# Patient Record
Sex: Male | Born: 1960 | Race: White | Hispanic: No | Marital: Married | Smoking: Former smoker
Health system: Southern US, Community
[De-identification: ages and names within clinical notes are randomized; demographics above are authoritative.]

## PROBLEM LIST (undated history)

## (undated) DIAGNOSIS — I4891 Unspecified atrial fibrillation: Secondary | ICD-10-CM

## (undated) DIAGNOSIS — M51369 Other intervertebral disc degeneration, lumbar region without mention of lumbar back pain or lower extremity pain: Secondary | ICD-10-CM

## (undated) DIAGNOSIS — G459 Transient cerebral ischemic attack, unspecified: Secondary | ICD-10-CM

## (undated) DIAGNOSIS — I10 Essential (primary) hypertension: Secondary | ICD-10-CM

## (undated) DIAGNOSIS — G8929 Other chronic pain: Secondary | ICD-10-CM

## (undated) DIAGNOSIS — R972 Elevated prostate specific antigen [PSA]: Secondary | ICD-10-CM

## (undated) DIAGNOSIS — M5136 Other intervertebral disc degeneration, lumbar region: Secondary | ICD-10-CM

## (undated) HISTORY — DX: Unspecified atrial fibrillation: I48.91

## (undated) HISTORY — DX: Other chronic pain: G89.29

## (undated) HISTORY — DX: Other intervertebral disc degeneration, lumbar region: M51.36

## (undated) HISTORY — DX: Essential (primary) hypertension: I10

## (undated) HISTORY — DX: Transient cerebral ischemic attack, unspecified: G45.9

## (undated) HISTORY — PX: OPEN ANTERIOR SHOULDER RECONSTRUCTION: SHX2100

## (undated) HISTORY — DX: Other intervertebral disc degeneration, lumbar region without mention of lumbar back pain or lower extremity pain: M51.369

## (undated) HISTORY — DX: Elevated prostate specific antigen (PSA): R97.20

## (undated) HISTORY — PX: BACK SURGERY: SHX140

## (undated) HISTORY — PX: OTHER SURGICAL HISTORY: SHX169

## (undated) HISTORY — PX: ATRIAL FIBRILLATION ABLATION: SHX5732

---

## 2021-10-12 ENCOUNTER — Other Ambulatory Visit: Payer: Self-pay | Admitting: Internal Medicine

## 2021-10-12 ENCOUNTER — Other Ambulatory Visit: Payer: Self-pay

## 2021-10-12 ENCOUNTER — Ambulatory Visit
Admission: RE | Admit: 2021-10-12 | Discharge: 2021-10-12 | Disposition: A | Discharge status: Home / Self Care | Payer: Self-pay | Source: Ambulatory Visit | Attending: Internal Medicine | Admitting: Internal Medicine

## 2021-10-12 DIAGNOSIS — R0781 Pleurodynia: Secondary | ICD-10-CM

## 2021-10-12 MED ORDER — KAPSPARGO SPRINKLE 50 MG PO CS24
EXTENDED_RELEASE_CAPSULE | ORAL | 3 refills | Status: DC
  Filled 2021-10-12: qty 30, 30d supply, fill #0
  Filled 2021-10-30: qty 30, 30d supply, fill #1

## 2021-10-12 MED ORDER — HYDROCODONE-ACETAMINOPHEN 10-325 MG PO TABS
ORAL_TABLET | ORAL | 0 refills | Status: DC
  Filled 2021-10-12: qty 60, 30d supply, fill #0

## 2021-10-13 ENCOUNTER — Other Ambulatory Visit: Payer: Self-pay

## 2021-10-16 ENCOUNTER — Other Ambulatory Visit: Payer: Self-pay

## 2021-10-27 ENCOUNTER — Other Ambulatory Visit: Payer: Self-pay

## 2021-10-27 MED ORDER — LOSARTAN POTASSIUM 50 MG PO TABS
ORAL_TABLET | ORAL | 2 refills | Status: DC
  Filled 2021-10-27: qty 30, 30d supply, fill #0
  Filled 2021-12-31: qty 30, 30d supply, fill #1
  Filled 2022-01-24 – 2022-01-31 (×2): qty 30, 30d supply, fill #2
  Filled 2022-04-02: qty 30, 30d supply, fill #3
  Filled 2022-04-25: qty 30, 30d supply, fill #4
  Filled 2022-05-30: qty 30, 30d supply, fill #5
  Filled 2022-06-29 (×2): qty 30, 30d supply, fill #6
  Filled 2022-07-25: qty 30, 30d supply, fill #7
  Filled 2022-08-24 (×2): qty 30, 30d supply, fill #8

## 2021-10-30 ENCOUNTER — Other Ambulatory Visit: Payer: Self-pay

## 2021-10-30 MED ORDER — CEPHALEXIN 500 MG PO CAPS
ORAL_CAPSULE | ORAL | 0 refills | Status: DC
  Filled 2021-10-30: qty 20, 5d supply, fill #0

## 2021-10-31 ENCOUNTER — Other Ambulatory Visit: Payer: Self-pay

## 2021-11-09 ENCOUNTER — Encounter: Payer: Self-pay | Admitting: Cardiology

## 2021-11-09 ENCOUNTER — Other Ambulatory Visit: Payer: Self-pay

## 2021-11-09 ENCOUNTER — Ambulatory Visit: Payer: BLUE CROSS/BLUE SHIELD | Admitting: Cardiology

## 2021-11-09 VITALS — BP 138/98 | HR 86 | Ht 73.0 in | Wt 230.4 lb

## 2021-11-09 DIAGNOSIS — I10 Essential (primary) hypertension: Secondary | ICD-10-CM | POA: Diagnosis not present

## 2021-11-09 DIAGNOSIS — I4819 Other persistent atrial fibrillation: Secondary | ICD-10-CM

## 2021-11-09 NOTE — Patient Instructions (Signed)
Medication Instructions:  Your physician recommends that you continue on your current medications as directed. Please refer to the Current Medication list given to you today.  *If you need a refill on your cardiac medications before your next appointment, please call your pharmacy*  Follow-Up: At [facility], you and your health needs are our priority.  As part of our continuing mission to provide you with exceptional heart care, we have created designated Provider Care Teams.  These Care Teams include your primary Cardiologist (physician) and Advanced Practice Providers (APPs -  Physician Assistants and Nurse Practitioners) who all work together to provide you with the care you need, when you need it.   Your next appointment:   June 1st, 2023 at 3:35pm  The format for your next appointment:   In Person  Provider:   Francis Dowse, PA-C   Important Information About Sugar

## 2021-11-09 NOTE — Progress Notes (Signed)
Cardiology CONSULT Note    Date:  11/09/2021   ID:  Chris Hayes, DOB 12/19/1960, MRN [id number]  PCP:  Emilio Aspen, MD  Cardiologist:  Armanda Magic, MD   Chief Complaint  Patient presents with   Atrial Fibrillation   Hypertension    History of Present Illness:  Chris Hayes is a 61 y.o. male who is being seen today for the evaluation of new onset atrial fibrillation at the request of Emilio Aspen, *.  This is a 62 year old male with a history of hypertension, remote atrial fibrillation status post ablation that was unsuccessful in the past and prior TIA in 1998.  He tells me that he had 2 different arrhythmias and only 1 of them they got and and unfortunately his A-fib ablation was unsuccessful and he has had issues with paroxysmal atrial fibrillation ever since.  He can tell when he is in atrial fibrillation as he feels bad and has shortness of breath with fatigue.  He does not think has been on it recently.Marland Kitchen  He says that he has another arrhythmia that goes 220bpm and that was successfully ablated.  He recently moved here from [place].  He has not been on anticoagulation.  He tells me he was tried on Coumadin but can never get his levels right and he has not ever heard of Xarelto or apixaban in the past.  He is now referred for further evaluation.  He denies any chest pain, chest pressure, shortness of breath, dyspnea on exertion, PND, orthopnea, dizziness, syncope or lower extremity edema.  Occasionally he will notice his heart racing up into the 120's at rest and then resolves.   Past Medical History:  Diagnosis Date   Atrial fibrillation (HCC)    DDD (degenerative disc disease), lumbar    Elevated PSA    HTN (hypertension)    Other chronic pain    TIA (transient ischemic attack)       Current Medications: Current Meds  Medication Sig   cephALEXin (KEFLEX) 500 MG capsule Take 1 capsule by mouth every 6 hrs for 5 days    HYDROcodone-acetaminophen (NORCO) 10-325 MG tablet take 1 tablet as needed by mouth twice a day 30 days   losartan (COZAAR) 50 MG tablet Take 1 tablet by mouth daily.   Metoprolol Succinate (KAPSPARGO SPRINKLE) 50 MG CS24 Take 1 capsule by mouth once a day 90 days    Allergies:   Patient has no allergy information on record.   Social History   Socioeconomic History   Marital status: Married    Spouse name: Not on file   Number of children: Not on file   Years of education: Not on file   Highest education level: Not on file  Occupational History   Not on file  Tobacco Use   Smoking status: Former    Packs/day: 2.00    Types: Cigarettes    Quit date: 11/03/1992    Years since quitting: 29.0   Smokeless tobacco: Never  Substance and Sexual Activity   Alcohol use: Not on file   Drug use: Not on file   Sexual activity: Not on file  Other Topics Concern   Not on file  Social History Narrative   Not on file   Social Determinants of Health   Financial Resource Strain: Not on file  Food Insecurity: Not on file  Transportation Needs: Not on file  Physical Activity: Not on file  Stress: Not on file  Social Connections:  Not on file     Family History:  The patient's family history includes COPD in his father; Coronary artery disease in his father; Parkinson's disease in his mother.   ROS:   Please see the history of present illness.    ROS All other systems reviewed and are negative.      View : No data to display.             PHYSICAL EXAM:   VS:  BP (!) 138/98   Pulse 86   Ht 6\' 1"  (1.854 m)   Wt 230 lb 6.4 oz (104.5 kg)   SpO2 97%   BMI 30.40 kg/m    GEN: Well nourished, well developed, in no acute distress  HEENT: normal  Neck: no JVD, carotid bruits, or masses Cardiac: Regular rate and rhythm; no murmurs, rubs, or gallops,no edema.  Intact distal pulses bilaterally.  Respiratory:  clear to auscultation bilaterally, normal work of breathing GI: soft,  nontender, nondistended, + BS MS: no deformity or atrophy  Skin: warm and dry, no rash Neuro:  Alert and Oriented x 3, Strength and sensation are intact Psych: euthymic mood, full affect  Wt Readings from Last 3 Encounters:  11/09/21 230 lb 6.4 oz (104.5 kg)      Studies/Labs Reviewed:   EKG:  EKG is not ordered today.  Patient refused EKG  Recent Labs: No results found for requested labs within last 8760 hours.   Lipid Panel No results found for: CHOL, TRIG, HDL, CHOLHDL, VLDL, LDLCALC, LDLDIRECT   [id number]-VASc Score = 3   This indicates a 3.2% annual risk of stroke. The patient's score is based upon: CHF History: 0 HTN History: 1 Diabetes History: 0 Stroke History: 2 Vascular Disease History: 0 Age Score: 0 Gender Score: 0   Additional studies/ records that were reviewed today include:  Office visit notes from PCP    ASSESSMENT:    1. Persistent atrial fibrillation (HCC)   2. Benign essential HTN      PLAN:  In order of problems listed above:  Atrial fibrillation -He apparently had a remote A-fib ablation in [place] in the past that was apparently unsuccessful -He has been on Toprol-XL 50 mg daily for suppression of PAF which he intermittently will have -His CHA2DS2-VASc score is 3 and should be on lifetime anticoagulation.  He tells me that he had been on Coumadin in [place] but could never get his levels right and Eliquis or Xarelto were never mentioned -I think we need to get his records from [place] to decide on whether he truly has had intermittent PAF since his ablation and if so then likely needs to be anticoagulated given his remote history of TIA and his history of hypertension which increases his CHA2DS2-VASc score 2 of 3 -He also had a successful ablation of a "very fast HR up to 220bpm" from the bottom of the heart  (?accessory bypass tract) -I think he would benefit from being followed by EP for his cardiac care since he does not have any  other general Cards issues>>refer to Dr. Ladona Ridgelaylor -He is going to see Clementeen Hoofenee Ursay NP tomorrow in EP clinic so he can she can get him established ASAP with an EP physician -Check 2D echo to assess LV function and atrial size  2.  Hypertension -BP is controlled on exam today -Continue prescription drug management with losartan 50 mg daily Toprol-XL 50 mg daily with as needed refills  Time Spent: 25 minutes total time of  encounter, including 15 minutes spent in face-to-face patient care on the date of this encounter. This time includes coordination of care and counseling regarding above mentioned problem list. Remainder of non-face-to-face time involved reviewing chart documents/testing relevant to the patient encounter and documentation in the medical record. I have independently reviewed documentation from referring provider  Medication Adjustments/Labs and Tests Ordered: Current medicines are reviewed at length with the patient today.  Concerns regarding medicines are outlined above.  Medication changes, Labs and Tests ordered today are listed in the Patient Instructions below.  There are no Patient Instructions on file for this visit.   Signed, Armanda Magic, MD  11/09/2021 10:49 AM    [facility] [address] [place], [place], [place]  [address] Phone: [phone]; Fax: [phone]

## 2021-11-10 ENCOUNTER — Other Ambulatory Visit: Payer: Self-pay

## 2021-11-10 ENCOUNTER — Ambulatory Visit: Payer: BLUE CROSS/BLUE SHIELD | Admitting: Physician Assistant

## 2021-11-10 MED ORDER — HYDROCODONE-ACETAMINOPHEN 10-325 MG PO TABS
ORAL_TABLET | ORAL | 0 refills | Status: DC
  Filled 2021-11-10: qty 60, 30d supply, fill #0

## 2021-11-10 NOTE — Progress Notes (Deleted)
Cardiology Office Note Date:  11/10/2021  Patient ID:  Chris Hayes, DOB 1960-09-27, MRN [id number] PCP:  Emilio Aspen, MD  Cardiologist:  Dr. Mayford Knife Electrophysiologist: ***  ***refresh   Chief Complaint: *** AFib management  History of Present Illness: Chris Hayes is a 61 y.o. male with history of HTN, TIA, AFib.  He was referred to Dr. Mayford Knife via his PMD for his hx of AFib hx. She elicited a hx of: "remote atrial fibrillation status post ablation that was unsuccessful in the past and prior TIA in 1998.  He tells me that he had 2 different arrhythmias and only 1 of them they got and and unfortunately his A-fib ablation was unsuccessful" At some point in the past was on warfarin, never an OAC and not currently anticoagulated with reports of labile INRs apparently He apparently reported one of the tachycardias ablated was w/rates 200's and from the bottom of his heart (?) Given his cardiac hx seemed purely arrhythmic referred to EP for ongoing management and decisions on a/c, though felt reasonable to await records from [place] to confirm his rhythm diagnosis'  Pt REFUSED an EKG yesterday  *** risk score is 3 w/hx of TIA , should be on a/c *** burden, ? 30 day? 2 week monitor *** update stuff, echo... *** when was he last seen in [place]?  Do we start over?? *** need an EKG, why did her refuse?? *** does he have any records?  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 Please review them in the "History" navigator section and refresh this SmartLink.  Current Outpatient  Medications  Medication Sig Dispense Refill   cephALEXin (KEFLEX) 500 MG capsule Take 1 capsule by mouth every 6 hrs for 5 days 20 capsule 0   HYDROcodone-acetaminophen (NORCO) 10-325 MG tablet take 1 tablet as needed by mouth twice a day 30 days 60 tablet 0   losartan (COZAAR) 50 MG tablet Take 1 tablet by mouth daily. 90 tablet 2   Metoprolol Succinate (KAPSPARGO SPRINKLE) 50 MG CS24 Take 1 capsule by mouth once a day 90 days 90 capsule 3   No current facility-administered medications for this visit.    Allergies:   Patient has no allergy information on record.   Social History:  The patient  reports that he quit smoking about 29 years ago. His smoking use included cigarettes. He smoked an average of 2 packs per day. He has never used smokeless tobacco.   Family History:  The patient's family history includes COPD in his father; Coronary artery disease in his father; Parkinson's disease in his mother.  ROS:  Please see the history of present illness.    All other systems are reviewed and otherwise negative.   PHYSICAL EXAM:  VS:  There were no vitals taken for this visit. BMI: There is no height or weight on file to calculate BMI. Well nourished, well developed, in no acute distress HEENT: normocephalic, atraumatic Neck: no JVD, carotid bruits or masses Cardiac:  *** RRR; no significant murmurs, no rubs, or gallops  Lungs:  *** CTA b/l, no wheezing, rhonchi or rales Abd: soft, nontender MS: no deformity or *** atrophy Ext: *** no edema Skin: warm and dry, no rash Neuro:  No gross deficits appreciated Psych: euthymic mood, full affect   EKG:  Done today and reviewed by myself shows  ***   Recent Labs: No results found for requested labs within last 8760 hours.  No results found for requested labs within last 8760 hours.   CrCl cannot be calculated (No successful lab value found.).   Wt Readings from Last 3 Encounters:  11/09/21 230 lb 6.4 oz (104.5 kg)     Other  studies reviewed: Additional studies/records reviewed today include: summarized above  ASSESSMENT AND PLAN:  ***  Disposition: F/u with ***  Current medicines are reviewed at length with the patient today.  The patient did not have any concerns regarding medicines.  Norma Fredrickson, PA-C 11/10/2021 6:00 AM     [facility] [address] [place] [address] [phone] (office)  [phone] (fax)

## 2021-11-20 ENCOUNTER — Other Ambulatory Visit: Payer: Self-pay

## 2021-11-20 MED ORDER — METOPROLOL SUCCINATE ER 50 MG PO TB24
ORAL_TABLET | Freq: Every day | ORAL | 5 refills | Status: DC
  Filled 2021-11-20 – 2021-12-31 (×2): qty 30, 30d supply, fill #0
  Filled 2022-01-24 – 2022-01-31 (×2): qty 30, 30d supply, fill #1
  Filled 2022-04-02: qty 30, 30d supply, fill #2
  Filled 2022-04-25: qty 30, 30d supply, fill #3
  Filled 2022-05-30: qty 30, 30d supply, fill #4
  Filled 2022-06-29 (×2): qty 30, 30d supply, fill #5

## 2021-11-27 ENCOUNTER — Inpatient Hospital Stay (HOSPITAL_COMMUNITY): Payer: BLUE CROSS/BLUE SHIELD | Admitting: Anesthesiology

## 2021-11-27 ENCOUNTER — Other Ambulatory Visit: Payer: Self-pay

## 2021-11-27 ENCOUNTER — Inpatient Hospital Stay (HOSPITAL_COMMUNITY)
Admission: EM | Admit: 2021-11-27 | Discharge: 2021-12-01 | DRG: 482 | Disposition: A | Discharge status: Home Health | Payer: BLUE CROSS/BLUE SHIELD | Attending: Family Medicine | Admitting: Family Medicine

## 2021-11-27 ENCOUNTER — Encounter (HOSPITAL_COMMUNITY): Payer: Self-pay | Admitting: Internal Medicine

## 2021-11-27 ENCOUNTER — Inpatient Hospital Stay (HOSPITAL_COMMUNITY): Payer: BLUE CROSS/BLUE SHIELD

## 2021-11-27 ENCOUNTER — Encounter (HOSPITAL_COMMUNITY)
Admission: EM | Disposition: A | Discharge status: Home Health | Payer: Self-pay | Source: Home / Self Care | Attending: Family Medicine

## 2021-11-27 ENCOUNTER — Emergency Department (HOSPITAL_COMMUNITY): Payer: BLUE CROSS/BLUE SHIELD

## 2021-11-27 DIAGNOSIS — I10 Essential (primary) hypertension: Secondary | ICD-10-CM | POA: Diagnosis present

## 2021-11-27 DIAGNOSIS — I480 Paroxysmal atrial fibrillation: Secondary | ICD-10-CM | POA: Diagnosis present

## 2021-11-27 DIAGNOSIS — Z8249 Family history of ischemic heart disease and other diseases of the circulatory system: Secondary | ICD-10-CM | POA: Diagnosis not present

## 2021-11-27 DIAGNOSIS — Z825 Family history of asthma and other chronic lower respiratory diseases: Secondary | ICD-10-CM

## 2021-11-27 DIAGNOSIS — Z87891 Personal history of nicotine dependence: Secondary | ICD-10-CM | POA: Diagnosis not present

## 2021-11-27 DIAGNOSIS — L8990 Pressure ulcer of unspecified site, unspecified stage: Secondary | ICD-10-CM | POA: Insufficient documentation

## 2021-11-27 DIAGNOSIS — S72009A Fracture of unspecified part of neck of unspecified femur, initial encounter for closed fracture: Secondary | ICD-10-CM | POA: Diagnosis present

## 2021-11-27 DIAGNOSIS — Z79899 Other long term (current) drug therapy: Secondary | ICD-10-CM

## 2021-11-27 DIAGNOSIS — Z820 Family history of epilepsy and other diseases of the nervous system: Secondary | ICD-10-CM

## 2021-11-27 DIAGNOSIS — S72002A Fracture of unspecified part of neck of left femur, initial encounter for closed fracture: Secondary | ICD-10-CM

## 2021-11-27 DIAGNOSIS — S72142A Displaced intertrochanteric fracture of left femur, initial encounter for closed fracture: Principal | ICD-10-CM | POA: Diagnosis present

## 2021-11-27 DIAGNOSIS — Z8673 Personal history of transient ischemic attack (TIA), and cerebral infarction without residual deficits: Secondary | ICD-10-CM

## 2021-11-27 DIAGNOSIS — Y929 Unspecified place or not applicable: Secondary | ICD-10-CM | POA: Diagnosis not present

## 2021-11-27 DIAGNOSIS — W19XXXA Unspecified fall, initial encounter: Principal | ICD-10-CM

## 2021-11-27 DIAGNOSIS — W08XXXA Fall from other furniture, initial encounter: Secondary | ICD-10-CM | POA: Diagnosis present

## 2021-11-27 HISTORY — PX: INTRAMEDULLARY (IM) NAIL INTERTROCHANTERIC: SHX5875

## 2021-11-27 LAB — CBC WITH DIFFERENTIAL/PLATELET
Abs Immature Granulocytes: 0.03 10*3/uL (ref 0.00–0.07)
Basophils Absolute: 0.1 10*3/uL (ref 0.0–0.1)
Basophils Relative: 1 %
Eosinophils Absolute: 0.3 10*3/uL (ref 0.0–0.5)
Eosinophils Relative: 3 %
HCT: 44.5 % (ref 39.0–52.0)
Hemoglobin: 14.9 g/dL (ref 13.0–17.0)
Immature Granulocytes: 0 %
Lymphocytes Relative: 10 %
Lymphs Abs: 0.9 10*3/uL (ref 0.7–4.0)
MCH: 30.3 pg (ref 26.0–34.0)
MCHC: 33.5 g/dL (ref 30.0–36.0)
MCV: 90.6 fL (ref 80.0–100.0)
Monocytes Absolute: 0.7 10*3/uL (ref 0.1–1.0)
Monocytes Relative: 7 %
Neutro Abs: 7.6 10*3/uL (ref 1.7–7.7)
Neutrophils Relative %: 79 %
Platelets: 189 10*3/uL (ref 150–400)
RBC: 4.91 MIL/uL (ref 4.22–5.81)
RDW: 12.8 % (ref 11.5–15.5)
WBC: 9.6 10*3/uL (ref 4.0–10.5)
nRBC: 0 % (ref 0.0–0.2)

## 2021-11-27 LAB — PROTIME-INR
INR: 1 (ref 0.8–1.2)
Prothrombin Time: 13.3 seconds (ref 11.4–15.2)

## 2021-11-27 LAB — TYPE AND SCREEN
ABO/RH(D): A POS
Antibody Screen: NEGATIVE

## 2021-11-27 LAB — SURGICAL PCR SCREEN
MRSA, PCR: NEGATIVE
Staphylococcus aureus: NEGATIVE

## 2021-11-27 LAB — BASIC METABOLIC PANEL
Anion gap: 7 (ref 5–15)
BUN: 11 mg/dL (ref 6–20)
CO2: 25 mmol/L (ref 22–32)
Calcium: 9.1 mg/dL (ref 8.9–10.3)
Chloride: 112 mmol/L — ABNORMAL HIGH (ref 98–111)
Creatinine, Ser: 0.84 mg/dL (ref 0.61–1.24)
GFR, Estimated: >60 mL/min (ref >60)
Glucose, Bld: 99 mg/dL (ref 70–99)
Potassium: 3.6 mmol/L (ref 3.5–5.1)
Sodium: 144 mmol/L (ref 135–145)

## 2021-11-27 LAB — ABO/RH: ABO/RH(D): A POS

## 2021-11-27 SURGERY — FIXATION, FRACTURE, INTERTROCHANTERIC, WITH INTRAMEDULLARY ROD
Anesthesia: General | Site: Hip | Laterality: Left

## 2021-11-27 MED ORDER — LACTATED RINGERS IV SOLN: INTRAVENOUS | Status: DC | PRN

## 2021-11-27 MED ORDER — AMISULPRIDE (ANTIEMETIC) 5 MG/2ML IV SOLN: 10.0000 mg | Freq: Once | INTRAVENOUS | Status: DC | PRN

## 2021-11-27 MED ORDER — DEXAMETHASONE SODIUM PHOSPHATE 10 MG/ML IJ SOLN
INTRAMUSCULAR | Status: AC
  Filled 2021-11-27: qty 1

## 2021-11-27 MED ORDER — ACETAMINOPHEN 325 MG PO TABS
325.0000 mg | ORAL_TABLET | Freq: Four times a day (QID) | ORAL | Status: DC | PRN
  Administered 2021-11-29 – 2021-12-01 (×2): 650 mg via ORAL
  Filled 2021-11-27 (×2): qty 2

## 2021-11-27 MED ORDER — FENTANYL CITRATE (PF) 250 MCG/5ML IJ SOLN
INTRAMUSCULAR | Status: DC | PRN
  Administered 2021-11-27: 25 ug via INTRAVENOUS
  Administered 2021-11-27: 50 ug via INTRAVENOUS
  Administered 2021-11-27: 25 ug via INTRAVENOUS

## 2021-11-27 MED ORDER — LOSARTAN POTASSIUM 50 MG PO TABS
50.0000 mg | ORAL_TABLET | Freq: Every day | ORAL | Status: DC
  Filled 2021-11-27 (×4): qty 1

## 2021-11-27 MED ORDER — OXYCODONE HCL 5 MG PO TABS: 5.0000 mg | ORAL_TABLET | Freq: Once | ORAL | Status: DC | PRN

## 2021-11-27 MED ORDER — LACTATED RINGERS IV SOLN: INTRAVENOUS | Status: DC

## 2021-11-27 MED ORDER — PHENYLEPHRINE 80 MCG/ML (10ML) SYRINGE FOR IV PUSH (FOR BLOOD PRESSURE SUPPORT)
PREFILLED_SYRINGE | INTRAVENOUS | Status: DC | PRN
  Administered 2021-11-27 (×2): 160 ug via INTRAVENOUS

## 2021-11-27 MED ORDER — HYDROMORPHONE HCL 2 MG/ML IJ SOLN
2.0000 mg | Freq: Once | INTRAMUSCULAR | Status: AC
  Administered 2021-11-27: 2 mg via INTRAVENOUS
  Filled 2021-11-27: qty 1

## 2021-11-27 MED ORDER — TRANEXAMIC ACID-NACL 1000-0.7 MG/100ML-% IV SOLN
1000.0000 mg | INTRAVENOUS | Status: AC
  Administered 2021-11-27: 1000 mg via INTRAVENOUS
  Filled 2021-11-27: qty 100

## 2021-11-27 MED ORDER — SUGAMMADEX SODIUM 500 MG/5ML IV SOLN
INTRAVENOUS | Status: AC
  Filled 2021-11-27: qty 5

## 2021-11-27 MED ORDER — POVIDONE-IODINE 10 % EX SWAB
2.0000 | Freq: Once | CUTANEOUS | Status: AC
  Administered 2021-11-27: 2 via TOPICAL

## 2021-11-27 MED ORDER — MENTHOL 3 MG MT LOZG: 1.0000 | LOZENGE | OROMUCOSAL | Status: DC | PRN

## 2021-11-27 MED ORDER — ACETAMINOPHEN 325 MG PO TABS: 650.0000 mg | ORAL_TABLET | Freq: Four times a day (QID) | ORAL | Status: DC | PRN

## 2021-11-27 MED ORDER — POVIDONE-IODINE 10 % EX SWAB: 2.0000 | Freq: Once | CUTANEOUS | Status: DC

## 2021-11-27 MED ORDER — HYDROMORPHONE HCL 1 MG/ML IJ SOLN: 0.5000 mg | INTRAMUSCULAR | Status: DC | PRN

## 2021-11-27 MED ORDER — METHOCARBAMOL 500 MG PO TABS
500.0000 mg | ORAL_TABLET | Freq: Four times a day (QID) | ORAL | Status: DC | PRN
  Administered 2021-11-28 – 2021-12-01 (×6): 500 mg via ORAL
  Filled 2021-11-27 (×6): qty 1

## 2021-11-27 MED ORDER — MIDAZOLAM HCL 2 MG/2ML IJ SOLN
INTRAMUSCULAR | Status: AC
  Filled 2021-11-27: qty 2

## 2021-11-27 MED ORDER — ACETAMINOPHEN 650 MG RE SUPP: 650.0000 mg | Freq: Four times a day (QID) | RECTAL | Status: DC | PRN

## 2021-11-27 MED ORDER — ACETAMINOPHEN 10 MG/ML IV SOLN
INTRAVENOUS | Status: AC
  Filled 2021-11-27: qty 100

## 2021-11-27 MED ORDER — ASPIRIN 81 MG PO TBEC
81.0000 mg | DELAYED_RELEASE_TABLET | Freq: Two times a day (BID) | ORAL | Status: DC
  Administered 2021-11-28 – 2021-12-01 (×7): 81 mg via ORAL
  Filled 2021-11-27 (×7): qty 1

## 2021-11-27 MED ORDER — POLYETHYLENE GLYCOL 3350 17 G PO PACK
17.0000 g | PACK | Freq: Every day | ORAL | Status: DC | PRN
  Administered 2021-11-30: 17 g via ORAL
  Filled 2021-11-27 (×2): qty 1

## 2021-11-27 MED ORDER — CEFAZOLIN SODIUM-DEXTROSE 2-4 GM/100ML-% IV SOLN
2.0000 g | INTRAVENOUS | Status: AC
  Administered 2021-11-27: 2 g via INTRAVENOUS
  Filled 2021-11-27: qty 100

## 2021-11-27 MED ORDER — METHOCARBAMOL 500 MG IVPB - SIMPLE MED: 500.0000 mg | Freq: Four times a day (QID) | INTRAVENOUS | Status: DC | PRN

## 2021-11-27 MED ORDER — ONDANSETRON HCL 4 MG/2ML IJ SOLN
INTRAMUSCULAR | Status: DC | PRN
  Administered 2021-11-27: 4 mg via INTRAVENOUS

## 2021-11-27 MED ORDER — BISACODYL 10 MG RE SUPP
10.0000 mg | Freq: Every day | RECTAL | Status: DC | PRN
Start: 2021-11-27 — End: 2021-12-01
  Administered 2021-11-30: 10 mg via RECTAL
  Filled 2021-11-27: qty 1

## 2021-11-27 MED ORDER — ACETAMINOPHEN 10 MG/ML IV SOLN
INTRAVENOUS | Status: DC | PRN
  Administered 2021-11-27: 1000 mg via INTRAVENOUS

## 2021-11-27 MED ORDER — PHENOL 1.4 % MT LIQD: 1.0000 | OROMUCOSAL | Status: DC | PRN

## 2021-11-27 MED ORDER — METOPROLOL SUCCINATE ER 50 MG PO TB24
50.0000 mg | ORAL_TABLET | Freq: Every day | ORAL | Status: DC
  Administered 2021-11-28 – 2021-12-01 (×4): 50 mg via ORAL
  Filled 2021-11-27 (×6): qty 1

## 2021-11-27 MED ORDER — MORPHINE SULFATE (PF) 2 MG/ML IV SOLN
0.5000 mg | INTRAVENOUS | Status: DC | PRN
  Administered 2021-11-27 – 2021-11-28 (×2): 1 mg via INTRAVENOUS
  Administered 2021-11-28: 0.5 mg via INTRAVENOUS
  Administered 2021-11-28 – 2021-11-29 (×6): 1 mg via INTRAVENOUS
  Administered 2021-11-29: 0.5 mg via INTRAVENOUS
  Administered 2021-11-30: 1 mg via INTRAVENOUS
  Filled 2021-11-27 (×12): qty 1

## 2021-11-27 MED ORDER — METOCLOPRAMIDE HCL 5 MG/ML IJ SOLN: 5.0000 mg | Freq: Three times a day (TID) | INTRAMUSCULAR | Status: DC | PRN

## 2021-11-27 MED ORDER — SUGAMMADEX SODIUM 200 MG/2ML IV SOLN
INTRAVENOUS | Status: DC | PRN
  Administered 2021-11-27: 217.8 mg via INTRAVENOUS

## 2021-11-27 MED ORDER — PHENYLEPHRINE HCL-NACL 20-0.9 MG/250ML-% IV SOLN
INTRAVENOUS | Status: DC | PRN
  Administered 2021-11-27: 20 ug/min via INTRAVENOUS

## 2021-11-27 MED ORDER — ROCURONIUM BROMIDE 10 MG/ML (PF) SYRINGE
PREFILLED_SYRINGE | INTRAVENOUS | Status: DC | PRN
  Administered 2021-11-27: 40 mg via INTRAVENOUS
  Administered 2021-11-27: 20 mg via INTRAVENOUS

## 2021-11-27 MED ORDER — HYDROCODONE-ACETAMINOPHEN 5-325 MG PO TABS
1.0000 | ORAL_TABLET | ORAL | Status: DC | PRN
  Administered 2021-11-29: 2 via ORAL
  Administered 2021-11-30: 1 via ORAL
  Administered 2021-11-30 – 2021-12-01 (×3): 2 via ORAL
  Filled 2021-11-27 (×5): qty 2
  Filled 2021-11-27: qty 1

## 2021-11-27 MED ORDER — OXYCODONE HCL 5 MG/5ML PO SOLN: 5.0000 mg | Freq: Once | ORAL | Status: DC | PRN

## 2021-11-27 MED ORDER — PROPOFOL 10 MG/ML IV BOLUS
INTRAVENOUS | Status: AC
  Filled 2021-11-27: qty 20

## 2021-11-27 MED ORDER — LIDOCAINE 2% (20 MG/ML) 5 ML SYRINGE
INTRAMUSCULAR | Status: DC | PRN
  Administered 2021-11-27: 60 mg via INTRAVENOUS

## 2021-11-27 MED ORDER — HYDROMORPHONE HCL 1 MG/ML IJ SOLN
0.2500 mg | INTRAMUSCULAR | Status: DC | PRN
  Administered 2021-11-27 (×2): 0.25 mg via INTRAVENOUS

## 2021-11-27 MED ORDER — TRANEXAMIC ACID-NACL 1000-0.7 MG/100ML-% IV SOLN
1000.0000 mg | Freq: Once | INTRAVENOUS | Status: AC
  Administered 2021-11-27: 1000 mg via INTRAVENOUS
  Filled 2021-11-27: qty 100

## 2021-11-27 MED ORDER — CEFAZOLIN SODIUM-DEXTROSE 2-4 GM/100ML-% IV SOLN
2.0000 g | Freq: Four times a day (QID) | INTRAVENOUS | Status: AC
  Administered 2021-11-27 – 2021-11-28 (×2): 2 g via INTRAVENOUS
  Filled 2021-11-27 (×2): qty 100

## 2021-11-27 MED ORDER — ONDANSETRON HCL 4 MG/2ML IJ SOLN: 4.0000 mg | Freq: Four times a day (QID) | INTRAMUSCULAR | Status: DC | PRN

## 2021-11-27 MED ORDER — MIDAZOLAM HCL 2 MG/2ML IJ SOLN
INTRAMUSCULAR | Status: DC | PRN
  Administered 2021-11-27: 2 mg via INTRAVENOUS

## 2021-11-27 MED ORDER — ONDANSETRON HCL 4 MG PO TABS: 4.0000 mg | ORAL_TABLET | Freq: Four times a day (QID) | ORAL | Status: DC | PRN

## 2021-11-27 MED ORDER — HYDROMORPHONE HCL 1 MG/ML IJ SOLN
1.0000 mg | INTRAMUSCULAR | Status: AC | PRN
  Administered 2021-11-27 (×2): 1 mg via INTRAVENOUS
  Filled 2021-11-27 (×2): qty 1

## 2021-11-27 MED ORDER — DOCUSATE SODIUM 100 MG PO CAPS
100.0000 mg | ORAL_CAPSULE | Freq: Two times a day (BID) | ORAL | Status: DC
  Administered 2021-11-27 – 2021-12-01 (×8): 100 mg via ORAL
  Filled 2021-11-27 (×9): qty 1

## 2021-11-27 MED ORDER — HYDROMORPHONE HCL 1 MG/ML IJ SOLN
INTRAMUSCULAR | Status: AC
  Filled 2021-11-27: qty 1

## 2021-11-27 MED ORDER — FENTANYL CITRATE (PF) 100 MCG/2ML IJ SOLN
INTRAMUSCULAR | Status: AC
  Filled 2021-11-27: qty 2

## 2021-11-27 MED ORDER — CHLORHEXIDINE GLUCONATE 4 % EX LIQD: 60.0000 mL | Freq: Once | CUTANEOUS | Status: DC

## 2021-11-27 MED ORDER — METOCLOPRAMIDE HCL 5 MG PO TABS: 5.0000 mg | ORAL_TABLET | Freq: Three times a day (TID) | ORAL | Status: DC | PRN

## 2021-11-27 MED ORDER — ONDANSETRON HCL 4 MG/2ML IJ SOLN: 4.0000 mg | Freq: Once | INTRAMUSCULAR | Status: DC | PRN

## 2021-11-27 MED ORDER — HYDROCODONE-ACETAMINOPHEN 7.5-325 MG PO TABS
1.0000 | ORAL_TABLET | ORAL | Status: DC | PRN
  Administered 2021-11-28 (×3): 1 via ORAL
  Administered 2021-11-29: 2 via ORAL
  Administered 2021-11-29 (×3): 1 via ORAL
  Administered 2021-11-30 (×4): 2 via ORAL
  Filled 2021-11-27: qty 1
  Filled 2021-11-27 (×2): qty 2
  Filled 2021-11-27 (×3): qty 1
  Filled 2021-11-27 (×2): qty 2
  Filled 2021-11-27: qty 1
  Filled 2021-11-27: qty 2
  Filled 2021-11-27: qty 1

## 2021-11-27 MED ORDER — 0.9 % SODIUM CHLORIDE (POUR BTL) OPTIME
TOPICAL | Status: DC | PRN
  Administered 2021-11-27: 1000 mL

## 2021-11-27 MED ORDER — PROPOFOL 10 MG/ML IV BOLUS
INTRAVENOUS | Status: DC | PRN
  Administered 2021-11-27: 100 mg via INTRAVENOUS

## 2021-11-27 MED ORDER — SUCCINYLCHOLINE CHLORIDE 200 MG/10ML IV SOSY
PREFILLED_SYRINGE | INTRAVENOUS | Status: DC | PRN
  Administered 2021-11-27: 200 mg via INTRAVENOUS

## 2021-11-27 MED ORDER — DEXAMETHASONE SODIUM PHOSPHATE 10 MG/ML IJ SOLN
INTRAMUSCULAR | Status: DC | PRN
  Administered 2021-11-27: 8 mg via INTRAVENOUS

## 2021-11-27 MED ORDER — SODIUM CHLORIDE 0.9 % IV SOLN: INTRAVENOUS | Status: DC

## 2021-11-27 MED ORDER — ONDANSETRON HCL 4 MG/2ML IJ SOLN
4.0000 mg | Freq: Four times a day (QID) | INTRAMUSCULAR | Status: DC | PRN
  Administered 2021-11-27 (×2): 4 mg via INTRAVENOUS
  Filled 2021-11-27 (×2): qty 2

## 2021-11-27 MED ORDER — ONDANSETRON HCL 4 MG/2ML IJ SOLN
INTRAMUSCULAR | Status: AC
  Filled 2021-11-27: qty 2

## 2021-11-27 SURGICAL SUPPLY — 38 items
BAG COUNTER SPONGE SURGICOUNT (BAG) IMPLANT
BAG ZIPLOCK 12X15 (MISCELLANEOUS) ×2 IMPLANT
BIT DRILL CANN LG 4.3MM (BIT) IMPLANT
COVER PERINEAL POST (MISCELLANEOUS) ×2 IMPLANT
COVER SURGICAL LIGHT HANDLE (MISCELLANEOUS) ×2 IMPLANT
DERMABOND ADVANCED (GAUZE/BANDAGES/DRESSINGS) ×1
DERMABOND ADVANCED .7 DNX12 (GAUZE/BANDAGES/DRESSINGS) IMPLANT
DRAPE STERI IOBAN 125X83 (DRAPES) ×2 IMPLANT
DRILL BIT CANN LG 4.3MM (BIT) ×2
DRSG AQUACEL AG ADV 3.5X 4 (GAUZE/BANDAGES/DRESSINGS) ×4 IMPLANT
DRSG AQUACEL AG ADV 3.5X 6 (GAUZE/BANDAGES/DRESSINGS) ×3 IMPLANT
DURAPREP 26ML APPLICATOR (WOUND CARE) ×2 IMPLANT
ELECT REM PT RETURN 15FT ADLT (MISCELLANEOUS) ×2 IMPLANT
GLOVE BIOGEL PI IND STRL 7.5 (GLOVE) ×2 IMPLANT
GLOVE BIOGEL PI IND STRL 8.5 (GLOVE) ×1 IMPLANT
GLOVE BIOGEL PI INDICATOR 7.5 (GLOVE) ×2
GLOVE BIOGEL PI INDICATOR 8.5 (GLOVE) ×1
GLOVE ECLIPSE 8.0 STRL XLNG CF (GLOVE) ×2 IMPLANT
GLOVE INDICATOR 6.5 STRL GRN (GLOVE) ×2 IMPLANT
GOWN STRL REUS W/ TWL LRG LVL3 (GOWN DISPOSABLE) ×2 IMPLANT
GOWN STRL REUS W/TWL LRG LVL3 (GOWN DISPOSABLE) ×2
GUIDEPIN VERSANAIL DSP 3.2X444 (ORTHOPEDIC DISPOSABLE SUPPLIES) ×1 IMPLANT
HIP FR NAIL LAG SCREW 10.5X110 (Orthopedic Implant) ×2 IMPLANT
KIT BASIN OR (CUSTOM PROCEDURE TRAY) ×2 IMPLANT
KIT TURNOVER KIT A (KITS) IMPLANT
MANIFOLD NEPTUNE II (INSTRUMENTS) ×2 IMPLANT
NAIL HIP FRACT 130D 11X180 (Screw) ×1 IMPLANT
PACK GENERAL/GYN (CUSTOM PROCEDURE TRAY) ×2 IMPLANT
PADDING CAST COTTON 6X4 STRL (CAST SUPPLIES) ×2 IMPLANT
PROTECTOR NERVE ULNAR (MISCELLANEOUS) ×2 IMPLANT
SCREW BONE CORTICAL 5.0X36 (Screw) ×1 IMPLANT
SCREW LAG HIP FR NAIL 10.5X110 (Orthopedic Implant) IMPLANT
SUT VIC AB 1 CT1 36 (SUTURE) ×2 IMPLANT
SUT VIC AB 2-0 CT1 27 (SUTURE) ×3
SUT VIC AB 2-0 CT1 27XBRD (SUTURE) ×2 IMPLANT
SUT VIC AB 2-0 CT1 TAPERPNT 27 (SUTURE) IMPLANT
TOWEL OR 17X26 10 PK STRL BLUE (TOWEL DISPOSABLE) ×2 IMPLANT
TOWEL OR NON WOVEN STRL DISP B (DISPOSABLE) ×2 IMPLANT

## 2021-11-27 NOTE — Transfer of Care (Addendum)
Immediate Anesthesia Transfer of Care Note  Patient: Chris Hayes  Procedure(s) Performed: INTRAMEDULLARY (IM) NAIL INTERTROCHANTRIC (Left: Hip)  Patient Location: PACU  Anesthesia Type:GA combined with regional for post-op pain  Level of Consciousness: drowsy and patient cooperative  Airway & Oxygen Therapy: Patient Spontanous Breathing and Patient connected to face mask oxygen  Post-op Assessment: Report given to RN and Post -op Vital signs reviewed and stable  Post vital signs: Reviewed and stable  Last Vitals:  Vitals Value Taken Time  BP 118/99 11/27/21 1915  Temp    Pulse 78 11/27/21 1921  Resp 10 11/27/21 1921  SpO2 100 % 11/27/21 1921  Vitals shown include unvalidated device data.  Last Pain:  Vitals:   11/27/21 1744  TempSrc: Oral  PainSc: 10-Worst pain ever         Complications: No notable events documented.

## 2021-11-27 NOTE — Anesthesia Procedure Notes (Signed)
Anesthesia Regional Block: Femoral nerve block   Pre-Anesthetic Checklist: , timeout performed,  Correct Patient, Correct Site, Correct Laterality,  Correct Procedure, Correct Position, site marked,  Risks and benefits discussed,  Surgical consent,  Pre-op evaluation,  At surgeon's request and post-op pain management  Laterality: Left  Prep: Maximum Sterile Barrier Precautions used, chloraprep       Needles:  Injection technique: Single-shot  Needle Type: Echogenic Stimulator Needle     Needle Length: 9cm  Needle Gauge: 22     Additional Needles:   Procedures:,,,, ultrasound used (permanent image in chart),,    Narrative:  Start time: 11/27/2021 5:58 PM End time: 11/27/2021 6:05 PM Injection made incrementally with aspirations every 5 mL.  Performed by: Personally  Anesthesiologist: Lannie Fields, DO  Additional Notes: Monitors applied. No increased pain on injection. No increased resistance to injection. Injection made in 5cc increments. Good needle visualization. Patient tolerated procedure well.

## 2021-11-27 NOTE — Brief Op Note (Signed)
11/27/2021  6:54 PM  PATIENT:  Chris Hayes  61 y.o. male  PRE-OPERATIVE DIAGNOSIS:  left comminuted intertrochanteric femur fracture  POST-OPERATIVE DIAGNOSIS:  left comminuted intertrochanteric femur fracture  PROCEDURE:  Procedure(s): INTRAMEDULLARY (IM) NAIL INTERTROCHANTRIC (Left)  SURGEON:  Surgeon(s) and Role:    Durene Romans, MD - Primary  PHYSICIAN ASSISTANT: Rosalene Billings, PA-C   ANESTHESIA:   regional and general  EBL:  100 mL   BLOOD ADMINISTERED:none  DRAINS: none   LOCAL MEDICATIONS USED:  NONE  SPECIMEN:  No Specimen  DISPOSITION OF SPECIMEN:  N/A  COUNTS:  YES  TOURNIQUET:  * No tourniquets in log *  DICTATION: .Other Dictation: Dictation Number [id number]  PLAN OF CARE: Admit to inpatient   PATIENT DISPOSITION:  PACU - hemodynamically stable.   Delay start of Pharmacological VTE agent (>24hrs) due to surgical blood loss or risk of bleeding: no

## 2021-11-27 NOTE — Interval H&P Note (Signed)
History and Physical Interval Note:  11/27/2021 6:09 PM  Chris Hayes  has presented today for surgery, with the diagnosis of intertrochanteric femur fracture.  The various methods of treatment have been discussed with the patient and family. After consideration of risks, benefits and other options for treatment, the patient has consented to  Procedure(s): INTRAMEDULLARY (IM) NAIL INTERTROCHANTRIC (Left) as a surgical intervention.  The patient's history has been reviewed, patient examined, no change in status, stable for surgery.  I have reviewed the patient's chart and labs.  Questions were answered to the patient's satisfaction.     Shelda Pal

## 2021-11-27 NOTE — H&P (View-Only) (Signed)
Reason for Consult:left hip fracture Referring Physician: Lockwood, MD (ER)  Chris Hayes is an 61 y.o. male.  HPI:  Patient presents via EMS with concern for pain in his left hip.  Just prior to calling EMS he fell from a ladder landing on his left hip.  Since that time has been nonambulatory with severe sustained pain in the left hip.  No head trauma, or other injuries, no other complaints.  EMS reports that in spite of 200 mcg of fentanyl he continues to complain of pain.  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 His smoking use included cigarettes. He smoked an average of 2 packs per day. He has never used smokeless tobacco. No history on file for alcohol use and drug use.  Allergies: No Known Allergies  Medications: I have reviewed the patient's current medications. Scheduled:  Results for orders placed or performed during the hospital encounter of 11/27/21 (from the past 24 hour(s))  Basic metabolic panel     Status: Abnormal   Collection Time: 11/27/21  2:10 PM  Result Value Ref Range   Sodium 144 135 - 145 mmol/L   Potassium 3.6 3.5 - 5.1 mmol/L   Chloride 112 (H) 98 - 111 mmol/L   CO2 25 22 - 32 mmol/L   Glucose, Bld 99 70 - 99 mg/dL   BUN 11 6 - 20 mg/dL   Creatinine, Ser 0.84 0.61 - 1.24 mg/dL   Calcium 9.1 8.9 - 10.3 mg/dL   GFR, Estimated >60 >60 mL/min   Anion gap 7 5 - 15  CBC with Differential     Status: None   Collection Time: 11/27/21   2:10 PM  Result Value Ref Range   WBC 9.6 4.0 - 10.5 K/uL   RBC 4.91 4.22 - 5.81 MIL/uL   Hemoglobin 14.9 13.0 - 17.0 g/dL   HCT 44.5 39.0 - 52.0 %   MCV 90.6 80.0 - 100.0 fL   MCH 30.3 26.0 - 34.0 pg   MCHC 33.5 30.0 - 36.0 g/dL   RDW 12.8 11.5 - 15.5 %   Platelets 189 150 - 400 K/uL   nRBC 0.0 0.0 - 0.2 %   Neutrophils Relative % 79 %   Neutro Abs 7.6 1.7 - 7.7 K/uL   Lymphocytes Relative 10 %   Lymphs Abs 0.9 0.7 - 4.0 K/uL   Monocytes Relative 7 %   Monocytes Absolute 0.7 0.1 - 1.0 K/uL   Eosinophils Relative 3 %   Eosinophils Absolute 0.3 0.0 - 0.5 K/uL   Basophils Relative 1 %   Basophils Absolute 0.1 0.0 - 0.1 K/uL   Immature Granulocytes 0 %   Abs Immature Granulocytes 0.03 0.00 - 0.07 K/uL  Protime-INR     Status: None   Collection Time: 11/27/21  2:10 PM  Result Value Ref Range   Prothrombin Time 13.3 11.4 - 15.2 seconds     INR 1.0 0.8 - 1.2  Type and screen [facility]     Status: None   Collection Time: 11/27/21  2:10 PM  Result Value Ref Range   ABO/RH(D) A POS    Antibody Screen NEG    Sample Expiration      11/30/2021,2359 Performed at [facility], [address], [place], [place] [address]      X-ray: CLINICAL DATA:  Fall from ladder   EXAM: DG HIP (WITH OR WITHOUT PELVIS) 2-3V LEFT   COMPARISON:  None Available.   FINDINGS: There is an acute, comminuted, intra-articular fracture deformity involving the proximal left femur. The fracture fragments are in near anatomic alignment. No signs of dislocation.   IMPRESSION: Acute, comminuted, intra-articular fracture deformity involving the proximal left femur.     Electronically Signed   By: Signa Kell M.D.  ROS: As per HPI History of recent ablation for paroxsymal a-fib  Otherwise normal No current blood thinner use  Blood pressure 123/74, pulse 83, temperature 98.2 F (36.8 C), temperature source Oral, resp. rate 20, height 6\' 1"  (1.854 m),  weight 108.9 kg, SpO2 97 %.  Physical Exam: Vitals and nursing note reviewed.  Constitutional:      General: He is not in acute distress.    Appearance: He is well-developed.  HENT:     Head: Normocephalic and atraumatic.  Eyes:     Conjunctiva/sclera: Conjunctivae normal.  Cardiovascular:     Rate and Rhythm: Normal rate and regular rhythm.  Pulmonary:     Effort: Pulmonary effort is normal. No respiratory distress.     Breath sounds: No stridor.  Abdominal:     General: There is no distension.  Musculoskeletal:        General: Deformity present.     Comments: Patient unwilling to move the left leg secondary to pain in the left hip.  There is no overt shortening of the left leg.  Knee, ankle grossly unremarkable, patient unwilling to move either secondary to pain in the hip however.  Skin:    General: Skin is warm and dry.  Neurological:     Mental Status: He is alert and oriented to person, place, and time.      Assessment/Plan: Left comminuted intertrochanteric hip fracture   Plan: Operative repair of his fracture is indicated NPO Will try and plan to address this this evening  11/27/2021, 4:53 PM

## 2021-11-27 NOTE — Anesthesia Procedure Notes (Signed)
Procedure Name: Intubation Date/Time: 11/27/2021 5:58 PM  Performed by: Eben Burow, CRNAPre-anesthesia Checklist: Patient identified, Emergency Drugs available, Suction available, Patient being monitored and Timeout performed Patient Re-evaluated:Patient Re-evaluated prior to induction Oxygen Delivery Method: Circle system utilized Preoxygenation: Pre-oxygenation with 100% oxygen Induction Type: IV induction Ventilation: Mask ventilation without difficulty Laryngoscope Size: Mac and 4 Grade View: Grade I Tube type: Oral Tube size: 7.5 mm Number of attempts: 1 Airway Equipment and Method: Stylet Placement Confirmation: ETT inserted through vocal cords under direct vision, positive ETCO2 and breath sounds checked- equal and bilateral Secured at: 23 cm Tube secured with: Tape Dental Injury: Teeth and Oropharynx as per pre-operative assessment

## 2021-11-27 NOTE — OR Nursing (Signed)
IN AND OUT CATH 450 ML OF URINE

## 2021-11-27 NOTE — Op Note (Unsigned)
NAME: Chris Hayes, Chris Hayes MEDICAL RECORD NO: [id number] ACCOUNT NO: [id number] DATE OF BIRTH: Jan 05, 1961 FACILITY: [facility] LOCATION: [facility] PHYSICIAN: Madlyn Frankel. Charlann Boxer, MD  Operative Report   DATE OF PROCEDURE: 11/27/2021  PREOPERATIVE DIAGNOSIS:  Comminuted left intertrochanteric femur fracture.  POSTOPERATIVE DIAGNOSIS:  Comminuted left intertrochanteric femur fracture.  PROCEDURE:  Intramedullary nailing of left intertrochanteric femur fracture utilizing Biomet Affixus nail, 11 x 180 mm with 130-degree lag screw measuring 110 mm with distal interlock.  SURGEON:  Madlyn Frankel. Charlann Boxer, MD  ASSISTANT:  Rosalene Billings, PA-C.  Note that Ms. Domenic Schwab was present for the entirety of the case from preoperative positioning, perioperative management of the operative extremity and equipment, general facilitation of the case and primary wound  closure.  ANESTHESIA:  Regional plus general.  BLOOD LOSS:  About 100 mL  COMPLICATIONS:  None.  INDICATIONS FOR PROCEDURE:  The patient is a 61 year old male who was working on a step ladder when he fell onto his left side.  He had immediate onset of pain.  He was taken to the emergency room where radiographs revealed an intertrochanteric femur  fracture with comminution.  The risks and benefits of the procedure were discussed and reviewed with regards to the necessity to repair and stabilize the fracture.  Risks of malunion, nonunion, need for future surgeries were reviewed.  Risks of  infection, DVT reviewed.  Consent was obtained for benefit of pain relief.  DESCRIPTION OF PROCEDURE:  The patient was brought to the operative theater.  Once adequate anesthesia, 2 grams of Ancef and 1 gram of tranexamic acid administered, he was positioned supine on the fracture table.  His unaffected right lower extremity was  flexed and abducted out of the way with bony prominences padded.  The patient was positioned against the perineal post.  The traction and internal  rotation was applied to the lower extremity.  Under fluoroscopic imaging, we confirmed near anatomic  reduction of the fracture.  Once this was done, the left hip from above the iliac crest to the knee was prepped and draped in sterile fashion using a shower curtain technique.  Fluoroscopy was brought back to the field.  A timeout was performed  identifying the patient, planned procedure, and the extremity.  Fluoroscopy was brought back to the field.  The guidewire was used to identify the tip of the trochanter.  An incision was then made proximal to this along the lateral aspect of the hip.  A  guidewire was then placed into the tip of the greater trochanter and passed across the fracture site, confirmed radiographically.  We then drilled open the proximal femur, then passed an 11 x 180 mm nail by hand to its appropriate depth.  Then, using the  insertion guide a guidewire was positioned into the center of the femoral head in AP and lateral planes.  I measured and selected a 110 mm lag screw.  I drilled for this.  The lag screw was then passed through the subchondral bone.  Traction was let off  the lower extremity in the traction compression wheel was applied medialize the shaft to the fracture.  I then tightened the proximal locking bolt down to the lag screw and then backed it off a quarter of a turn to allow for further compression.  I then  placed a distal interlock through the insertion jig measuring 36 mm.  At this point, the insertion jig was removed.  Final radiographs were obtained.  We irrigated all wounds.  The proximal wound  was closed in layers using #1 Vicryl on the gluteal  fascia.  The remainder of the wound was closed with 2-0 Vicryl.  All wounds were cleaned, dried and dressed sterilely using surgical glue and Aquacel dressings.  The patient was brought to the recovery room in stable condition, tolerating the procedure  well.  Findings reviewed with his wife.  Postoperatively, I will have  him be partial weightbearing to allow for adequate healing and prevent significant compression.  We will see him back in the office in 2 weeks.   PUS D: 11/27/2021 7:01:12 pm T: 11/27/2021 7:47:00 pm  JOB: [id number]/ [id number]

## 2021-11-27 NOTE — H&P (Signed)
History and Physical    Patient: Chris Hayes HCW:[id number] DOB: [birth year] DOA: 11/27/2021 DOS: the patient was seen and examined on 11/27/2021 PCP: Emilio Aspen, MD  Patient coming from: Home  Chief Complaint:  Chief Complaint  Patient presents with   Hip Pain   HPI: Chris Hayes is a 61 y.o. male with medical history significant of atrial fibrillation status post ablation, currently not on anticoagulation, hypertension who presents to the emergency department following injury to the hip after falling off stool while screwing something, causing pt to fall to the ground.  Patient had marked pain.  Patient subsequently presented to the emergency department.  The emergency department, patient was noted to have acute comminuted intra-articular fracture deformity involving the proximal left femur.  Orthopedic surgery consulted.  Hospital service consulted for consideration for medical admission. Denies sob or chest pains  Review of Systems: As mentioned in the history of present illness. All other systems reviewed and are negative. 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 His smoking use included cigarettes. He smoked an average of 2 packs per day. He has never used smokeless tobacco. No history on file for alcohol use and drug use.  No Known Allergies  Family History  Problem Relation Age of Onset   Parkinson's disease Mother    Coronary artery disease Father    COPD Father     Prior to Admission medications   Medication Sig Start Date End Date Taking? Authorizing Provider   cephALEXin (KEFLEX) 500 MG capsule Take 1 capsule by mouth every 6 hrs for 5 days 10/30/21     HYDROcodone-acetaminophen (NORCO) 10-325 MG tablet 1 tablet as needed by mouth twice a day 30 days 11/09/21     losartan (COZAAR) 50 MG tablet Take 1 tablet by mouth daily. 10/27/21     metoprolol succinate (TOPROL-XL) 50 MG 24 hr tablet Take 1 tablet by mouth once daily. 11/20/21       Physical Exam: Vitals:   11/27/21 1408 11/27/21 1411 11/27/21 1530  BP:  123/74   Pulse:  83   Resp: 20    Temp: 98.2 F (36.8 C)    TempSrc: Oral    SpO2:   97%  Weight: 108.9 kg    Height: 6\' 1"  (1.854 m)     General exam: Awake, laying in bed, in nad Respiratory system: Normal respiratory effort, no wheezing Cardiovascular system: regular rate, s1, s2 Gastrointestinal system: Soft, nondistended, positive BS Central nervous system: CN2-12 grossly intact, strength intact Extremities: Perfused, no clubbing Skin: Normal skin turgor, no notable skin lesions seen Psychiatry: Mood normal // no visual hallucinations   Data Reviewed:  X-ray personally reviewed.  Finding of acute comminuted intra-articular fracture deformity involving the proximal left femur.  Assessment and Plan: No notes have been filed under this hospital service. Service: Hospitalist  Left proximal femoral fracture -Status posttraumatic injury, noted on x-ray -Orthopedic surgery consulted, planning for surgery -We will keep n.p.o. for now -Continue analgesia as needed  History of atrial fibrillation -Patient established with: Cardiology, outpatient records  noted. -Patient has history of ablation in the past.  Currently not on anticoagulation -Heart rate currently rate controlled -Continue to monitor on telemetry  Hypertension -Blood pressure presently stable -Would resume home medications once confirmed by pharmacy   Advance Care Planning:   Code Status: Full Code full code  Consults: Orthopedic surgery  Family  Communication: Patient in room  Severity of Illness: The appropriate patient status for this patient is INPATIENT. Inpatient status is judged to be reasonable and necessary in order to provide the required intensity of service to ensure the patient's safety. The patient's presenting symptoms, physical exam findings, and initial radiographic and laboratory data in the context of their chronic comorbidities is felt to place them at high risk for further clinical deterioration. Furthermore, it is not anticipated that the patient will be medically stable for discharge from the hospital within 2 midnights of admission.   * I certify that at the point of admission it is my clinical judgment that the patient will require inpatient hospital care spanning beyond 2 midnights from the point of admission due to high intensity of service, high risk for further deterioration and high frequency of surveillance required.*  Author: Rickey Barbara, MD 11/27/2021 5:06 PM  For on call review [email or web address].

## 2021-11-27 NOTE — Discharge Instructions (Signed)

## 2021-11-27 NOTE — Anesthesia Postprocedure Evaluation (Signed)
Anesthesia Post Note  Patient: Chris Hayes  Procedure(s) Performed: INTRAMEDULLARY (IM) NAIL INTERTROCHANTRIC (Left: Hip)     Patient location during evaluation: PACU Anesthesia Type: General and Regional Level of consciousness: awake and alert, oriented and patient cooperative Pain management: pain level controlled Vital Signs Assessment: post-procedure vital signs reviewed and stable Respiratory status: spontaneous breathing, nonlabored ventilation and respiratory function stable Cardiovascular status: blood pressure returned to baseline and stable Postop Assessment: no apparent nausea or vomiting Anesthetic complications: no   No notable events documented.  Last Vitals:  Vitals:   11/27/21 1744 11/27/21 1915  BP:  (!) 118/99  Pulse: 92 79  Resp:  13  Temp: 37.1 C 36.8 C  SpO2: 92% 99%                  Lannie Fields

## 2021-11-27 NOTE — Anesthesia Preprocedure Evaluation (Addendum)
Anesthesia Evaluation  Patient identified by MRN, date of birth, ID band Patient awake    Reviewed: Allergy & Precautions, NPO status , Patient's Chart, lab work & pertinent test results, reviewed documented beta blocker date and time   Airway Mallampati: III  TM Distance: >3 FB Neck ROM: Full    Dental no notable dental hx. (+) Teeth Intact, Dental Advisory Given   Pulmonary former smoker,    Pulmonary exam normal breath sounds clear to auscultation       Cardiovascular hypertension, Pt. on medications and Pt. on home beta blockers Normal cardiovascular exam+ dysrhythmias Atrial Fibrillation  Rhythm:Regular Rate:Normal  Recent move from [place] and was seen by cardiology earlier this month for his PAF not really being actively managed  Was on coumadin in past but wasn't able to maintain normal levels (per pt) and was never tried on any novel A/Cs. Remote hx TIA per pt as well. When seen by cards 2 weeks ago, trying to get records from [place] prior to starting him back on A/C Per pt has had a successful ablation of a "very fast HR up to 220bpm" from the bottom of the heart  (?accessory bypass tract) Pending referral to EP lab and referral for echo   Neuro/Psych TIAnegative psych ROS   GI/Hepatic negative GI ROS, Neg liver ROS,   Endo/Other  negative endocrine ROS  Renal/GU negative Renal ROS  negative genitourinary   Musculoskeletal  (+) Arthritis , Osteoarthritis,    Abdominal   Peds  Hematology negative hematology ROS (+) Hb 14.9, plt 189   Anesthesia Other Findings   Reproductive/Obstetrics negative OB ROS                            Anesthesia Physical Anesthesia Plan  ASA: 3  Anesthesia Plan: General   Post-op Pain Management: Ofirmev IV (intra-op)* and Regional block*   Induction: Intravenous  PONV Risk Score and Plan: 2 and Ondansetron, Dexamethasone, Midazolam and  Treatment may vary due to age or medical condition  Airway Management Planned: Oral ETT  Additional Equipment: None  Intra-op Plan:   Post-operative Plan: Extubation in OR  Informed Consent: I have reviewed the patients History and Physical, chart, labs and discussed the procedure including the risks, benefits and alternatives for the proposed anesthesia with the patient or authorized representative who has indicated his/her understanding and acceptance.     Dental advisory given  Plan Discussed with: CRNA  Anesthesia Plan Comments: (High risk of stroke- afib, not anticoagulated and hx of TIA in past Only access is 18G in external jugular- will place peripheral IVs once asleep )      Anesthesia Quick Evaluation

## 2021-11-27 NOTE — Consult Note (Signed)
Reason for Consult:left hip fracture Referring Physician: Jeraldine Loots, MD (ER)  Chris Hayes is an 61 y.o. male.  HPI:  Patient presents via EMS with concern for pain in his left hip.  Just prior to calling EMS he fell from a ladder landing on his left hip.  Since that time has been nonambulatory with severe sustained pain in the left hip.  No head trauma, or other injuries, no other complaints.  EMS reports that in spite of 200 mcg of fentanyl he continues to complain of pain.  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 His smoking use included cigarettes. He smoked an average of 2 packs per day. He has never used smokeless tobacco. No history on file for alcohol use and drug use.  Allergies: No Known Allergies  Medications: I have reviewed the patient's current medications. Scheduled:  Results for orders placed or performed during the hospital encounter of 11/27/21 (from the past 24 hour(s))  Basic metabolic panel     Status: Abnormal   Collection Time: 11/27/21  2:10 PM  Result Value Ref Range   Sodium 144 135 - 145 mmol/L   Potassium 3.6 3.5 - 5.1 mmol/L   Chloride 112 (H) 98 - 111 mmol/L   CO2 25 22 - 32 mmol/L   Glucose, Bld 99 70 - 99 mg/dL   BUN 11 6 - 20 mg/dL   Creatinine, Ser 2.42 0.61 - 1.24 mg/dL   Calcium 9.1 8.9 - 68.3 mg/dL   GFR, Estimated >41 >96 mL/min   Anion gap 7 5 - 15  CBC with Differential     Status: None   Collection Time: 11/27/21   2:10 PM  Result Value Ref Range   WBC 9.6 4.0 - 10.5 K/uL   RBC 4.91 4.22 - 5.81 MIL/uL   Hemoglobin 14.9 13.0 - 17.0 g/dL   HCT 22.2 97.9 - 89.2 %   MCV 90.6 80.0 - 100.0 fL   MCH 30.3 26.0 - 34.0 pg   MCHC 33.5 30.0 - 36.0 g/dL   RDW 11.9 41.7 - 40.8 %   Platelets 189 150 - 400 K/uL   nRBC 0.0 0.0 - 0.2 %   Neutrophils Relative % 79 %   Neutro Abs 7.6 1.7 - 7.7 K/uL   Lymphocytes Relative 10 %   Lymphs Abs 0.9 0.7 - 4.0 K/uL   Monocytes Relative 7 %   Monocytes Absolute 0.7 0.1 - 1.0 K/uL   Eosinophils Relative 3 %   Eosinophils Absolute 0.3 0.0 - 0.5 K/uL   Basophils Relative 1 %   Basophils Absolute 0.1 0.0 - 0.1 K/uL   Immature Granulocytes 0 %   Abs Immature Granulocytes 0.03 0.00 - 0.07 K/uL  Protime-INR     Status: None   Collection Time: 11/27/21  2:10 PM  Result Value Ref Range   Prothrombin Time 13.3 11.4 - 15.2 seconds  INR 1.0 0.8 - 1.2  Type and screen [facility]     Status: None   Collection Time: 11/27/21  2:10 PM  Result Value Ref Range   ABO/RH(D) A POS    Antibody Screen NEG    Sample Expiration      11/30/2021,2359 Performed at [facility], [address], [place], [place] [address]      X-ray: CLINICAL DATA:  Fall from ladder   EXAM: DG HIP (WITH OR WITHOUT PELVIS) 2-3V LEFT   COMPARISON:  None Available.   FINDINGS: There is an acute, comminuted, intra-articular fracture deformity involving the proximal left femur. The fracture fragments are in near anatomic alignment. No signs of dislocation.   IMPRESSION: Acute, comminuted, intra-articular fracture deformity involving the proximal left femur.     Electronically Signed   By: Signa Kell M.D.  ROS: As per HPI History of recent ablation for paroxsymal a-fib  Otherwise normal No current blood thinner use  Blood pressure 123/74, pulse 83, temperature 98.2 F (36.8 C), temperature source Oral, resp. rate 20, height 6\' 1"  (1.854 m),  weight 108.9 kg, SpO2 97 %.  Physical Exam: Vitals and nursing note reviewed.  Constitutional:      General: He is not in acute distress.    Appearance: He is well-developed.  HENT:     Head: Normocephalic and atraumatic.  Eyes:     Conjunctiva/sclera: Conjunctivae normal.  Cardiovascular:     Rate and Rhythm: Normal rate and regular rhythm.  Pulmonary:     Effort: Pulmonary effort is normal. No respiratory distress.     Breath sounds: No stridor.  Abdominal:     General: There is no distension.  Musculoskeletal:        General: Deformity present.     Comments: Patient unwilling to move the left leg secondary to pain in the left hip.  There is no overt shortening of the left leg.  Knee, ankle grossly unremarkable, patient unwilling to move either secondary to pain in the hip however.  Skin:    General: Skin is warm and dry.  Neurological:     Mental Status: He is alert and oriented to person, place, and time.      Assessment/Plan: Left comminuted intertrochanteric hip fracture   Plan: Operative repair of his fracture is indicated NPO Will try and plan to address this this evening  11/27/2021, 4:53 PM

## 2021-11-27 NOTE — ED Provider Notes (Signed)
[facility]-EMERGENCY DEPT Provider Note   CSN: [id number] Arrival date & time: 11/27/21  1334     History  Chief Complaint  Patient presents with   Hip Pain    Chris Hayes is a 61 y.o. male.  HPI Patient presents via EMS with concern for pain in his left hip.  Just prior to calling EMS he fell, and his left hip slammed into a solid object.  Since that time has been nonambulatory with severe sustained pain in the left hip.  No head trauma, or other injuries, no other complaints.  EMS reports that in spite of 200 mcg of fentanyl he continues to complain of pain.    Home Medications Prior to Admission medications   Medication Sig Start Date End Date Taking? Authorizing Provider  cephALEXin (KEFLEX) 500 MG capsule Take 1 capsule by mouth every 6 hrs for 5 days 10/30/21     HYDROcodone-acetaminophen (NORCO) 10-325 MG tablet 1 tablet as needed by mouth twice a day 30 days 11/09/21     losartan (COZAAR) 50 MG tablet Take 1 tablet by mouth daily. 10/27/21     metoprolol succinate (TOPROL-XL) 50 MG 24 hr tablet Take 1 tablet by mouth once daily. 11/20/21         Allergies    Patient has no known allergies.    Review of Systems   Review of Systems  All other systems reviewed and are negative.   Physical Exam Updated Vital Signs BP 123/74   Pulse 83   Temp 98.2 F (36.8 C) (Oral)   Resp 20   Ht 6\' 1"  (1.854 m)   Wt 108.9 kg   SpO2 97%   BMI 31.66 kg/m  Physical Exam Vitals and nursing note reviewed.  Constitutional:      General: He is not in acute distress.    Appearance: He is well-developed.  HENT:     Head: Normocephalic and atraumatic.  Eyes:     Conjunctiva/sclera: Conjunctivae normal.  Cardiovascular:     Rate and Rhythm: Normal rate and regular rhythm.  Pulmonary:     Effort: Pulmonary effort is normal. No respiratory distress.     Breath sounds: No stridor.  Abdominal:     General: There is no distension.  Musculoskeletal:         General: Deformity present.     Comments: Patient unwilling to move the left leg secondary to pain in the left hip.  There is no overt shortening of the left leg.  Knee, ankle grossly unremarkable, patient unwilling to move either secondary to pain in the hip however.  Skin:    General: Skin is warm and dry.  Neurological:     Mental Status: He is alert and oriented to person, place, and time.     ED Results / Procedures / Treatments   Labs (all labs ordered are listed, but only abnormal results are displayed) Labs Reviewed  BASIC METABOLIC PANEL - Abnormal; Notable for the following components:      Result Value   Chloride 112 (*)    All other components within normal limits  CBC WITH DIFFERENTIAL/PLATELET  PROTIME-INR  TYPE AND SCREEN  ABO/RH    EKG None  Radiology DG Hip Unilat With Pelvis 2-3 Views Left  Result Date: 11/27/2021 CLINICAL DATA:  Fall from ladder EXAM: DG HIP (WITH OR WITHOUT PELVIS) 2-3V LEFT COMPARISON:  None Available. FINDINGS: There is an acute, comminuted, intra-articular fracture deformity involving the proximal left femur.  The fracture fragments are in near anatomic alignment. No signs of dislocation. IMPRESSION: Acute, comminuted, intra-articular fracture deformity involving the proximal left femur. Electronically Signed   By: Signa Kell M.D.   On: 11/27/2021 15:37    Procedures Procedures    Medications Ordered in ED Medications  ondansetron (ZOFRAN) injection 4 mg (4 mg Intravenous Given 11/27/21 1451)  HYDROmorphone (DILAUDID) injection 1 mg (1 mg Intravenous Given 11/27/21 1452)  HYDROmorphone (DILAUDID) injection 2 mg (2 mg Intravenous Given 11/27/21 1529)    ED Course/ Medical Decision Making/ A&P This patient with a Hx of A-fib that is post ablation presents to the ED for concern of hip pain following a fall, this involves an extensive number of treatment options, and is a complaint that carries with it a high risk of complications and  morbidity.    The differential diagnosis includes fracture, bleed given his anticoagulation apparently   Social Determinants of Health:  No limits  Additional history obtained:  Additional history and/or information obtained from EMS, notable for details above in HPI   After the initial evaluation, orders, including: Labs x-ray ECG monitoring were initiated.   Patient placed on Cardiac and Pulse-Oximetry Monitors. The patient was maintained on a cardiac monitor.  The cardiac monitored showed an rhythm of sinus rhythm rate 80s unremarkable The patient was also maintained on pulse oximetry. The readings were typically 99% unremarkable   On repeat evaluation of the patient stayed the same  Lab Tests:  I personally interpreted labs.  The pertinent results include: Unremarkable  Imaging Studies ordered:  I independently visualized and interpreted imaging which showed left intratrochanteric fracture I agree with the radiologist interpretation  Consultations Obtained:  I requested consultation with the orthopedic, Dr. Charlann Boxer,  and discussed lab and imaging findings as well as pertinent plan - they recommend: Additional medicine with plan for surgical repair tomorrow  Dispostion / Final MDM:  After consideration of the diagnostic results and the patient's response to treatment, patient will be admitted for monitoring, management.  4:27 PM Patient's wife now present, she to is aware of all findings.  This adult male presents after mechanical fall was found to have comminuted intra-articular left proximal femur fracture.  Patient is distally neurovascularly unremarkable, has no other complaints.  He does have a history of A-fib, but not currently taking anticoagulation for unclear.  After discussion with orthopedics, internal medicine patient was admitted for surgical repair monitoring, management.  Final Clinical Impression(s) / ED Diagnoses Final diagnoses:  Fall, initial  encounter  Closed fracture of left hip, initial encounter [facility])    Rx / DC Orders ED Discharge Orders     None         Gerhard Munch, MD 11/27/21 1627

## 2021-11-28 ENCOUNTER — Encounter (HOSPITAL_COMMUNITY): Payer: Self-pay | Admitting: Orthopedic Surgery

## 2021-11-28 DIAGNOSIS — S72002A Fracture of unspecified part of neck of left femur, initial encounter for closed fracture: Secondary | ICD-10-CM | POA: Diagnosis not present

## 2021-11-28 LAB — COMPREHENSIVE METABOLIC PANEL
ALT: 27 U/L (ref 0–44)
AST: 31 U/L (ref 15–41)
Albumin: 3.6 g/dL (ref 3.5–5.0)
Alkaline Phosphatase: 64 U/L (ref 38–126)
Anion gap: 8 (ref 5–15)
BUN: 12 mg/dL (ref 6–20)
CO2: 24 mmol/L (ref 22–32)
Calcium: 8.8 mg/dL — ABNORMAL LOW (ref 8.9–10.3)
Chloride: 109 mmol/L (ref 98–111)
Creatinine, Ser: 1.02 mg/dL (ref 0.61–1.24)
GFR, Estimated: >60 mL/min (ref >60)
Glucose, Bld: 178 mg/dL — ABNORMAL HIGH (ref 70–99)
Potassium: 4.1 mmol/L (ref 3.5–5.1)
Sodium: 141 mmol/L (ref 135–145)
Total Bilirubin: 1.2 mg/dL (ref 0.3–1.2)
Total Protein: 6.2 g/dL — ABNORMAL LOW (ref 6.5–8.1)

## 2021-11-28 LAB — CBC
HCT: 40 % (ref 39.0–52.0)
HCT: 39.6 % (ref 39.0–52.0)
Hemoglobin: 13.1 g/dL (ref 13.0–17.0)
Hemoglobin: 13 g/dL (ref 13.0–17.0)
MCH: 30.3 pg (ref 26.0–34.0)
MCH: 30.3 pg (ref 26.0–34.0)
MCHC: 32.8 g/dL (ref 30.0–36.0)
MCHC: 32.8 g/dL (ref 30.0–36.0)
MCV: 92.6 fL (ref 80.0–100.0)
MCV: 92.3 fL (ref 80.0–100.0)
Platelets: 177 10*3/uL (ref 150–400)
Platelets: 180 10*3/uL (ref 150–400)
RBC: 4.32 MIL/uL (ref 4.22–5.81)
RBC: 4.29 MIL/uL (ref 4.22–5.81)
RDW: 12.8 % (ref 11.5–15.5)
RDW: 13 % (ref 11.5–15.5)
WBC: 8.8 10*3/uL (ref 4.0–10.5)
WBC: 9.2 10*3/uL (ref 4.0–10.5)
nRBC: 0 % (ref 0.0–0.2)
nRBC: 0 % (ref 0.0–0.2)

## 2021-11-28 LAB — BASIC METABOLIC PANEL
Anion gap: 7 (ref 5–15)
BUN: 12 mg/dL (ref 6–20)
CO2: 25 mmol/L (ref 22–32)
Calcium: 8.8 mg/dL — ABNORMAL LOW (ref 8.9–10.3)
Chloride: 109 mmol/L (ref 98–111)
Creatinine, Ser: 1.06 mg/dL (ref 0.61–1.24)
GFR, Estimated: >60 mL/min (ref >60)
Glucose, Bld: 178 mg/dL — ABNORMAL HIGH (ref 70–99)
Potassium: 4.1 mmol/L (ref 3.5–5.1)
Sodium: 141 mmol/L (ref 135–145)

## 2021-11-28 LAB — HIV ANTIBODY (ROUTINE TESTING W REFLEX): HIV Screen 4th Generation wRfx: NONREACTIVE

## 2021-11-28 NOTE — Progress Notes (Signed)
  Progress Note   Patient: Chris Hayes:[id number] DOB: 1960-06-27 DOA: 11/27/2021     1 DOS: the patient was seen and examined on 11/28/2021   Brief hospital course: 61 y.o. male with medical history significant of atrial fibrillation status post ablation, currently not on anticoagulation, hypertension who presents to the emergency department following injury to the hip after falling off stool while screwing something, causing pt to fall to the ground.  Patient had marked pain.  Patient subsequently presented to the emergency department.   The emergency department, patient was noted to have acute comminuted intra-articular fracture deformity involving the proximal left femur.  Orthopedic surgery consulted.  Hospital service consulted for consideration for medical admission. Denies sob or chest pains   Assessment and Plan: Left proximal femoral fracture -Status posttraumatic injury, noted on x-ray -Orthopedic surgery consulted, s/p surgery 6/19 -Seen by PT, recs for HHPT -Continue analgesia as needed   History of atrial fibrillation -Patient established with: Cardiology, outpatient records noted. -Patient has history of ablation in the past.  Currently not on anticoagulation -Heart rate remains rate controlled -Continue to monitor on telemetry   Hypertension -Blood pressure presently stable -Would resume home medications once confirmed by pharmacy      Subjective: Reports pain improved since having surgery  Physical Exam: Vitals:   11/28/21 0532 11/28/21 0533 11/28/21 0811 11/28/21 1242  BP: 99/63 104/69 115/71 111/61  Pulse: 85 82 88 75  Resp: 18  (!) 22 18  Temp: 97.7 F (36.5 C)  98.7 F (37.1 C) 98.3 F (36.8 C)  TempSrc:    Oral  SpO2: 93%  96% 98%  Weight:      Height:       General exam: Awake, laying in bed, in nad Respiratory system: Normal respiratory effort, no wheezing Cardiovascular system: regular rate, s1, s2 Gastrointestinal system: Soft,  nondistended, positive BS Central nervous system: CN2-12 grossly intact, strength intact Extremities: Perfused, no clubbing Skin: Normal skin turgor, no notable skin lesions seen Psychiatry: Mood normal // no visual hallucinations   Data Reviewed:  Labs reviewed: Na 141, Cr 1.02   Family Communication: Pt in room, family at bedside  Disposition: Status is: Inpatient Remains inpatient appropriate because: Severity of illness  Planned Discharge Destination: Home     Author: Rickey Barbara, MD 11/28/2021 2:22 PM  For on call review [email or web address].

## 2021-11-28 NOTE — Evaluation (Signed)
Occupational Therapy Evaluation Patient Details Name: Chris Hayes MRN: [id number] DOB: June 12, 1960 Today's Date: 11/28/2021   History of Present Illness 61 yo male sustained fall , resulting in left comminuted intertrochanteric femur fracture, S/P INTRAMEDULLARY (IM) NAIL INTERTROCHANTRIC on 11/27/2021   Clinical Impression   This 61 yo male admitted and underwent above presents to acute OT with PLOF of being totally independent with all aspects of life.Currently he is setup/S-total A for all basic ADLs. He will continue to benefit from acute OT without need for follow up.      Recommendations for follow up therapy are one component of a multi-disciplinary discharge planning process, led by the attending physician.  Recommendations may be updated based on patient status, additional functional criteria and insurance authorization.   Follow Up Recommendations  No OT follow up    Assistance Recommended at Discharge Intermittent Supervision/Assistance  Patient can return home with the following A lot of help with bathing/dressing/bathroom;Assistance with cooking/housework;Assist for transportation;Help with stairs or ramp for entrance    Functional Status Assessment  Patient has had a recent decline in their functional status and demonstrates the ability to make significant improvements in function in a reasonable and predictable amount of time.  Equipment Recommendations  None recommended by OT       Precautions / Restrictions Precautions Precautions: Fall Restrictions Weight Bearing Restrictions: Yes LLE Weight Bearing: Partial weight bearing LLE Partial Weight Bearing Percentage or Pounds: 50      Mobility Bed Mobility Overal bed mobility: Needs Assistance Bed Mobility: Sit to Supine       Sit to supine: Mod assist   General bed mobility comments: extra time, required mod assistance to lift LLE onto bed, lifted both together    Transfers Overall transfer level:  Needs assistance Equipment used: Rolling walker (2 wheels) Transfers: Sit to/from Stand, Bed to chair/wheelchair/BSC Sit to Stand: Mod assist, +2 safety/equipment     Step pivot transfers: Mod assist, +2 safety/equipment     General transfer comment: Patient able to step to bed  using RW, antalgic and TDWB on LLE. max support on UE's,      Balance Overall balance assessment: Needs assistance, History of Falls (fluke fall from step stool) Sitting-balance support: Bilateral upper extremity supported, Feet supported Sitting balance-Leahy Scale: Fair     Standing balance support: Bilateral upper extremity supported, During functional activity, Reliant on assistive device for balance Standing balance-Leahy Scale: Poor Standing balance comment: heavy reliance on RW                           ADL either performed or assessed with clinical judgement   ADL Overall ADL's : Needs assistance/impaired Eating/Feeding: Independent;Sitting   Grooming: Set up;Sitting   Upper Body Bathing: Set up;Sitting   Lower Body Bathing: Maximal assistance Lower Body Bathing Details (indicate cue type and reason): mod A +2 sit<>stand from recliner Upper Body Dressing : Set up;Sitting   Lower Body Dressing: Total assistance Lower Body Dressing Details (indicate cue type and reason): mod A +2 sit<>stand from recliner Toilet Transfer: +2 for physical assistance;Stand-pivot;Rolling walker (2 wheels);Moderate assistance Toilet Transfer Details (indicate cue type and reason): simulated recliiner to bed Toileting- Clothing Manipulation and Hygiene: Minimal assistance Toileting - Clothing Manipulation Details (indicate cue type and reason): Mod A +2 sit<>stand       General ADL Comments: Wife decided to order at 3n1/shower seat for [facility] after we discussed his only option from the DME  company through hospital. Due to his height and build he really needed an elongated toilet seat, but did not meet  the weight requirement for a wide 3n1.     Vision Patient Visual Report: No change from baseline              Pertinent Vitals/Pain Pain Assessment Pain Assessment: 0-10 Pain Score: 10-Worst pain ever Pain Location: left hip Pain Descriptors / Indicators: Aching, Guarding, Grimacing, Moaning, Discomfort Pain Intervention(s): Limited activity within patient's tolerance, Monitored during session, Repositioned, Ice applied     Hand Dominance Right   Extremity/Trunk Assessment Upper Extremity Assessment Upper Extremity Assessment: Overall WFL for tasks assessed      Communication Communication Communication: No difficulties   Cognition Arousal/Alertness: Awake/alert Behavior During Therapy: WFL for tasks assessed/performed Overall Cognitive Status: Within Functional Limits for tasks assessed                                                  Home Living Family/patient expects to be discharged to:: Private residence Living Arrangements: Spouse/significant other Available Help at Discharge: Family;Available 24 hours/day Type of Home: House Home Access: Stairs to enter [employer] of Steps: 3 Entrance Stairs-Rails: None Home Layout: Two level;Able to live on main level with bedroom/bathroom Alternate Level Stairs-Number of Steps: has finished basement with  roll out bed, no steps to enter from outside to basement,   Bathroom Shower/Tub: Walk-in shower;Curtain   Bathroom Toilet: Standard     Home Equipment: None   Additional Comments: If goes into main level, BR is not accessible due to being remodeled.      Prior Functioning/Environment Prior Level of Function : Independent/Modified Independent                        OT Problem List: Decreased strength;Decreased range of motion;Impaired balance (sitting and/or standing);Pain      OT Treatment/Interventions: Self-care/ADL training;DME and/or AE instruction;Patient/family  education;Balance training    OT Goals(Current goals can be found in the care plan section) Acute Rehab OT Goals Patient Stated Goal: for pain to get better and be able to move better OT Goal Formulation: With patient/family Time For Goal Achievement: 12/12/21 Potential to Achieve Goals: Good  OT Frequency: Min 2X/week    Co-evaluation PT/OT/SLP Co-Evaluation/Treatment: Yes (partial) Reason for Co-Treatment: For patient/therapist safety;To address functional/ADL transfers PT goals addressed during session: Mobility/safety with mobility OT goals addressed during session: ADL's and self-care;Strengthening/ROM      AM-PAC OT "6 Clicks" Daily Activity     Outcome Measure Help from another person eating meals?: None Help from another person taking care of personal grooming?: A Little Help from another person toileting, which includes using toliet, bedpan, or urinal?: A Lot Help from another person bathing (including washing, rinsing, drying)?: A Lot Help from another person to put on and taking off regular upper body clothing?: A Little Help from another person to put on and taking off regular lower body clothing?: Total 6 Click Score: 15   End of Session Equipment Utilized During Treatment: Gait belt;Rolling walker (2 wheels)  Activity Tolerance: Patient limited by pain Patient left: in bed;with call bell/phone within reach;with family/visitor present  OT Visit Diagnosis: Unsteadiness on feet (R26.81);Other abnormalities of gait and mobility (R26.89);Pain Pain - Right/Left: Left Pain - part of body: Leg  Time: 1132-1210 OT Time Calculation (min): 38 min Charges:  OT General Charges $OT Visit: 1 Visit OT Evaluation $OT Eval Moderate Complexity: 1 Mod OT Treatments $Self Care/Home Management : 8-22 mins Chris Hayes, OTR/L Acute Rehab Services [facility] [phone] Office [phone]    Evette Georges 11/28/2021, 3:15 PM

## 2021-11-28 NOTE — Evaluation (Signed)
Physical Therapy Evaluation Patient Details Name: Chris Hayes MRN: [id number] DOB: 10-23-1960 Today's Date: 11/28/2021  History of Present Illness  61 yo male sustained fall , resulting in left comminuted intertrochanteric femur fracture, S/P INTRAMEDULLARY (IM) NAIL INTERTROCHANTRIC on 11/27/2021  Clinical Impression  The patient reporting significant left hip pain, has been premedicated, requested a muscle relaxer. Patient required mod assistance to move the LLE. Patient did stand and take pivot steps to recliner. Patient  will benefit from HHPT at [facility]. Pt admitted with above diagnosis.  Pt currently with functional limitations due to the deficits listed below (see PT Problem List). Pt will benefit from skilled PT to increase their independence and safety with mobility to allow discharge to the venue listed below.     His home is under renovation so will assist in logistics of where he will stay.     Recommendations for follow up therapy are one component of a multi-disciplinary discharge planning process, led by the attending physician.  Recommendations may be updated based on patient status, additional functional criteria and insurance authorization.  Follow Up Recommendations Home health PT    Assistance Recommended at Discharge Frequent or constant Supervision/Assistance  Patient can return home with the following  A little help with walking and/or transfers;A little help with bathing/dressing/bathroom;Help with stairs or ramp for entrance;Assist for transportation;Assistance with cooking/housework    Equipment Recommendations  RW and crutches  Recommendations for Other Services    OT   Functional Status Assessment       Precautions / Restrictions Precautions Precautions: Fall Restrictions Weight Bearing Restrictions: Yes LLE Weight Bearing: Partial weight bearing LLE Partial Weight Bearing Percentage or Pounds: 50      Mobility  Bed Mobility Overal bed  mobility: Needs Assistance Bed Mobility: Supine to Sit     Supine to sit: Mod assist     General bed mobility comments: extra time, assist with leg, pt. used belt on left foot to slide leg across bed.    Transfers Overall transfer level: Needs assistance Equipment used: Rolling walker (2 wheels) Transfers: Sit to/from Stand, Bed to chair/wheelchair/BSC Sit to Stand: Mod assist, +2 physical assistance, +2 safety/equipment   Step pivot transfers: Mod assist, +2 safety/equipment, +2 physical assistance       General transfer comment: Patient able to step to recliner, right knee tending to be flexed, max support on UE's,Mostly TDWB on LLE    Ambulation/Gait                  Stairs            Wheelchair Mobility    Modified Rankin (Stroke Patients Only)       Balance Overall balance assessment: Needs assistance, History of Falls (fluke fall from step stool) Sitting-balance support: Bilateral upper extremity supported, Feet supported Sitting balance-Leahy Scale: Fair     Standing balance support: Bilateral upper extremity supported, During functional activity, Reliant on assistive device for balance Standing balance-Leahy Scale: Poor                               Pertinent Vitals/Pain Pain Assessment Pain Assessment: 0-10 Pain Score: 10-Worst pain ever Pain Location: left hip Pain Descriptors / Indicators: Aching, Guarding, Grimacing, Moaning, Discomfort Pain Intervention(s): Premedicated before session, Monitored during session, Ice applied, Limited activity within patient's tolerance    Home Living Family/patient expects to be discharged to:: Private residence Living Arrangements: Spouse/significant other Available Help  at Discharge: Family;Available 24 hours/day Type of Home: House Home Access: Stairs to enter Entrance Stairs-Rails: None Entrance Stairs-Number of Steps: 3 Alternate Level Stairs-Number of Steps: has finished basement  with  roll out bed, no steps to enter from outside to basement, Home Layout: Two level;Able to live on main level with bedroom/bathroom Home Equipment: None Additional Comments: If goes into main level, BR is not accessible due to being remodeled.    Prior Function Prior Level of Function : Independent/Modified Independent                     Hand Dominance        Extremity/Trunk Assessment        Lower Extremity Assessment Lower Extremity Assessment: LLE deficits/detail LLE Deficits / Details: requires mod assist and use of  belt  on foot to self move the leg    Cervical / Trunk Assessment Cervical / Trunk Assessment: Normal  Communication      Cognition Arousal/Alertness: Awake/alert Behavior During Therapy: WFL for tasks assessed/performed Overall Cognitive Status: Within Functional Limits for tasks assessed                                          General Comments      Exercises Total Joint Exercises Heel Slides: AAROM, Left, 5 reps   Assessment/Plan    PT Assessment Patient needs continued PT services  PT Problem List Decreased strength;Decreased knowledge of precautions;Decreased range of motion;Decreased mobility;Decreased activity tolerance;Decreased safety awareness;Pain       PT Treatment Interventions DME instruction;Functional mobility training;Patient/family education;Gait training;Therapeutic activities;Stair training;Therapeutic exercise    PT Goals (Current goals can be found in the Care Plan section)  Acute Rehab PT Goals Patient Stated Goal: to go home, no pain PT Goal Formulation: With patient/family Time For Goal Achievement: 11/28/21 Potential to Achieve Goals: Good    Frequency Min 6X/week     Co-evaluation               AM-PAC PT "6 Clicks" Mobility  Outcome Measure Help needed turning from your back to your side while in a flat bed without using bedrails?: A Lot Help needed moving from lying on your  back to sitting on the side of a flat bed without using bedrails?: A Lot Help needed moving to and from a bed to a chair (including a wheelchair)?: A Lot Help needed standing up from a chair using your arms (e.g., wheelchair or bedside chair)?: A Lot Help needed to walk in hospital room?: Total Help needed climbing 3-5 steps with a railing? : Total 6 Click Score: 10    End of Session Equipment Utilized During Treatment: Gait belt Activity Tolerance: Patient limited by pain Patient left: in chair;with call bell/phone within reach;with family/visitor present Nurse Communication: Mobility status PT Visit Diagnosis: Unsteadiness on feet (R26.81);Muscle weakness (generalized) (M62.81);Difficulty in walking, not elsewhere classified (R26.2);Pain Pain - Right/Left: Left Pain - part of body: Hip    Time: 1030-1101 PT Time Calculation (min) (ACUTE ONLY): 31 min   Charges:   PT Evaluation $PT Eval Low Complexity: 1 Low PT Treatments $Therapeutic Activity: 8-22 mins        Blanchard Kelch PT Acute Rehabilitation Services Pager [phone] Office [phone]   [name] 11/28/2021, 12:18 PM

## 2021-11-28 NOTE — Hospital Course (Signed)
61 y.o. male with medical history significant of atrial fibrillation status post ablation, currently not on anticoagulation, hypertension who presents to the emergency department following injury to the hip after falling off stool while screwing something, causing pt to fall to the ground.  Patient had marked pain.  Patient subsequently presented to the emergency department.   The emergency department, patient was noted to have acute comminuted intra-articular fracture deformity involving the proximal left femur.  Orthopedic surgery consulted.  Hospital service consulted for consideration for medical admission. Denies sob or chest pains

## 2021-11-28 NOTE — Progress Notes (Addendum)
Physical Therapy Treatment Patient Details Name: Chris Hayes MRN: [id number] DOB: 27-Jun-1960 Today's Date: 11/28/2021   History of Present Illness 61 yo male sustained fall , resulting in left comminuted intertrochanteric femur fracture, S/P INTRAMEDULLARY (IM) NAIL INTERTROCHANTRIC on 11/27/2021    PT Comments    Patient  tolerated  standing with mod assistance from recliner, small steps to  bed using RW, decreased WB on LLE. Marland Kitchen Continue PT to progress ambulation.  HR 109 with activity.  Recommendations for follow up therapy are one component of a multi-disciplinary discharge planning process, led by the attending physician.  Recommendations may be updated based on patient status, additional functional criteria and insurance authorization.  Follow Up Recommendations  Home health PT     Assistance Recommended at Discharge Frequent or constant Supervision/Assistance  Patient can return home with the following A little help with walking and/or transfers;A little help with bathing/dressing/bathroom;Help with stairs or ramp for entrance;Assist for transportation;Assistance with cooking/housework   Equipment Recommendations  Rolling walker (2 wheels)    Recommendations for Other Services       Precautions / Restrictions Precautions Precautions: Fall Restrictions Weight Bearing Restrictions: Yes LLE Weight Bearing: Partial weight bearing LLE Partial Weight Bearing Percentage or Pounds: 50     Mobility  Bed Mobility Overal bed mobility: Needs Assistance Bed Mobility: Sit to Supine     Supine to sit: Mod assist Sit to supine: Mod assist   General bed mobility comments: extra time, required mod assistance to lift LLE onto bed, lifted both together    Transfers Overall transfer level: Needs assistance Equipment used: Rolling walker (2 wheels) Transfers: Sit to/from Stand, Bed to chair/wheelchair/BSC Sit to Stand: Mod assist, +2 safety/equipment   Step pivot transfers:  Mod assist, +2 safety/equipment       General transfer comment: Patient able to step tobed  using RW, antalgic and TDWB on LLE. max support on UE's,    Ambulation/Gait                   Stairs             Wheelchair Mobility    Modified Rankin (Stroke Patients Only)       Balance Overall balance assessment: Needs assistance, History of Falls (fluke fall from step stool) Sitting-balance support: Bilateral upper extremity supported, Feet supported Sitting balance-Leahy Scale: Fair     Standing balance support: Bilateral upper extremity supported, During functional activity, Reliant on assistive device for balance Standing balance-Leahy Scale: Poor                              Cognition Arousal/Alertness: Awake/alert Behavior During Therapy: WFL for tasks assessed/performed Overall Cognitive Status: Within Functional Limits for tasks assessed                                          Exercises Total Joint Exercises Heel Slides: AAROM, Left, 5 reps    General Comments        Pertinent Vitals/Pain Pain Assessment Pain Assessment: 0-10 Pain Score: 10-Worst pain ever Pain Location: left hip Pain Descriptors / Indicators: Aching, Guarding, Grimacing, Moaning, Discomfort Pain Intervention(s): Monitored during session, Premedicated before session, Ice applied    Home Living Family/patient expects to be discharged to:: Private residence Living Arrangements: Spouse/significant other Available Help at Discharge: Family;Available 24 hours/day  Type of Home: House Home Access: Stairs to enter Entrance Stairs-Rails: None Entrance Stairs-Number of Steps: 3 Alternate Level Stairs-Number of Steps: has finished basement with  roll out bed, no steps to enter from outside to basement, Home Layout: Two level;Able to live on main level with bedroom/bathroom Home Equipment: None Additional Comments: If goes into main level, BR is not  accessible due to being remodeled.    Prior Function            PT Goals (current goals can now be found in the care plan section) Acute Rehab PT Goals Patient Stated Goal: to go home, no pain PT Goal Formulation: With patient/family Time For Goal Achievement: 11/28/21 Potential to Achieve Goals: Good Progress towards PT goals: Progressing toward goals    Frequency    Min 6X/week      PT Plan Current plan remains appropriate    Co-evaluation PT/OT/SLP Co-Evaluation/Treatment: Yes Reason for Co-Treatment: For patient/therapist safety;To address functional/ADL transfers PT goals addressed during session: Mobility/safety with mobility OT goals addressed during session: ADL's and self-care      AM-PAC PT "6 Clicks" Mobility   Outcome Measure  Help needed turning from your back to your side while in a flat bed without using bedrails?: A Lot Help needed moving from lying on your back to sitting on the side of a flat bed without using bedrails?: A Lot Help needed moving to and from a bed to a chair (including a wheelchair)?: A Lot Help needed standing up from a chair using your arms (e.g., wheelchair or bedside chair)?: A Lot Help needed to walk in hospital room?: Total Help needed climbing 3-5 steps with a railing? : Total 6 Click Score: 10    End of Session Equipment Utilized During Treatment: Gait belt Activity Tolerance: Patient limited by pain Patient left: in bed;with call bell/phone within reach;with family/visitor present Nurse Communication: Mobility status PT Visit Diagnosis: Unsteadiness on feet (R26.81);Muscle weakness (generalized) (M62.81);Difficulty in walking, not elsewhere classified (R26.2);Pain Pain - Right/Left: Left Pain - part of body: Hip     Time: 9629-5284 PT Time Calculation (min) (ACUTE ONLY): 19 min  Charges:  $Therapeutic Activity: 8-22 mins                    Chris Hayes PT Acute Rehabilitation Services Office [phone] Weekend  pager-[phone]    Chris Hayes 11/28/2021, 3:05 PM

## 2021-11-28 NOTE — TOC Initial Note (Addendum)
  Transition of Care [facility]) - Initial/Assessment Note    Patient Details  Name: Chris Hayes MRN: [id number] Date of Birth: 06-23-1960  Transition of Care [facility]) CM/SW Contact:    Lanier Clam, RN Phone Number: 11/28/2021, 10:14 AM  Clinical Narrative: Await PT recc.                1:51p-PT recc HHPT await OT recc-checking on HHC agency to accept. Rw-[facility] to deliver to rm prior d/c. -2p-[facility] rep Ashley-HHPT. -3:30p-No OT needs.   Expected Discharge Plan:  (TBD) Barriers to Discharge: Continued Medical Work up   Patient Goals and CMS Choice Patient states their goals for this hospitalization and ongoing recovery are:: Home CMS Medicare.gov Compare Post Acute Care list provided to:: Patient Choice offered to / list presented to : Patient  Expected Discharge Plan and Services Expected Discharge Plan:  (TBD)   Discharge Planning Services: CM Consult   Living arrangements for the past 2 months: Single Family Home                                      Prior Living Arrangements/Services Living arrangements for the past 2 months: Single Family Home Lives with:: Spouse Patient language and need for interpreter reviewed:: Yes Do you feel safe going back to the place where you live?: Yes      Need for Family Participation in Patient Care: Yes (Comment) Care giver support system in place?: Yes (comment)   Criminal Activity/Legal Involvement Pertinent to Current Situation/Hospitalization: No - Comment as needed  Activities of Daily Living Home Assistive Devices/Equipment: None ADL Screening (condition at time of admission) Patient's cognitive ability adequate to safely complete daily activities?: Yes Is the patient deaf or have difficulty hearing?: No Does the patient have difficulty seeing, even when wearing glasses/contacts?: No Does the patient have difficulty concentrating, remembering, or making decisions?: No Patient able to express need for assistance  with ADLs?: Yes Does the patient have difficulty dressing or bathing?: Yes Independently performs ADLs?: Yes (appropriate for developmental age) Does the patient have difficulty walking or climbing stairs?: Yes Weakness of Legs: Both Weakness of Arms/Hands: None  Permission Sought/Granted Permission sought to share information with : Case Manager Permission granted to share information with : Yes, Verbal Permission Granted  Share Information with NAME: Case manager           Emotional Assessment Appearance:: Appears stated age Attitude/Demeanor/Rapport: Gracious Affect (typically observed): Accepting Orientation: : Oriented to Self, Oriented to Place, Oriented to  Time, Oriented to Situation Alcohol / Substance Use: Not Applicable Psych Involvement: No (comment)  Admission diagnosis:  Hip fracture (HCC) [S72.009A] Closed fracture of left hip, initial encounter (HCC) [S72.002A] Fall, initial encounter [W19.XXXA] Patient Active Problem List   Diagnosis Date Noted   Hip fracture (HCC) 11/27/2021   PCP:  Emilio Aspen, MD Pharmacy:   [facility] [address] [place] [address] Phone: [phone] Fax: [phone]     Social Determinants of Health (SDOH) Interventions    Readmission Risk Interventions     No data to display

## 2021-11-29 ENCOUNTER — Other Ambulatory Visit: Payer: Self-pay

## 2021-11-29 ENCOUNTER — Encounter (HOSPITAL_COMMUNITY): Payer: Self-pay | Admitting: Internal Medicine

## 2021-11-29 DIAGNOSIS — S72002A Fracture of unspecified part of neck of left femur, initial encounter for closed fracture: Secondary | ICD-10-CM | POA: Diagnosis not present

## 2021-11-29 DIAGNOSIS — I10 Essential (primary) hypertension: Secondary | ICD-10-CM

## 2021-11-29 LAB — CBC
HCT: 35.4 % — ABNORMAL LOW (ref 39.0–52.0)
Hemoglobin: 11.7 g/dL — ABNORMAL LOW (ref 13.0–17.0)
MCH: 30.6 pg (ref 26.0–34.0)
MCHC: 33.1 g/dL (ref 30.0–36.0)
MCV: 92.7 fL (ref 80.0–100.0)
Platelets: 144 10*3/uL — ABNORMAL LOW (ref 150–400)
RBC: 3.82 MIL/uL — ABNORMAL LOW (ref 4.22–5.81)
RDW: 12.9 % (ref 11.5–15.5)
WBC: 11.9 10*3/uL — ABNORMAL HIGH (ref 4.0–10.5)
nRBC: 0 % (ref 0.0–0.2)

## 2021-11-29 LAB — COMPREHENSIVE METABOLIC PANEL
ALT: 20 U/L (ref 0–44)
AST: 22 U/L (ref 15–41)
Albumin: 3.1 g/dL — ABNORMAL LOW (ref 3.5–5.0)
Alkaline Phosphatase: 57 U/L (ref 38–126)
Anion gap: 5 (ref 5–15)
BUN: 16 mg/dL (ref 6–20)
CO2: 26 mmol/L (ref 22–32)
Calcium: 8.2 mg/dL — ABNORMAL LOW (ref 8.9–10.3)
Chloride: 113 mmol/L — ABNORMAL HIGH (ref 98–111)
Creatinine, Ser: 0.83 mg/dL (ref 0.61–1.24)
GFR, Estimated: >60 mL/min (ref >60)
Glucose, Bld: 121 mg/dL — ABNORMAL HIGH (ref 70–99)
Potassium: 3.8 mmol/L (ref 3.5–5.1)
Sodium: 144 mmol/L (ref 135–145)
Total Bilirubin: 0.9 mg/dL (ref 0.3–1.2)
Total Protein: 5.4 g/dL — ABNORMAL LOW (ref 6.5–8.1)

## 2021-11-29 MED ORDER — HYDROCODONE-ACETAMINOPHEN 7.5-325 MG PO TABS
1.0000 | ORAL_TABLET | ORAL | 0 refills | Status: DC | PRN
  Filled 2021-11-29: qty 30, 5d supply, fill #0

## 2021-11-29 MED ORDER — ASPIRIN 81 MG PO CHEW
81.0000 mg | CHEWABLE_TABLET | Freq: Two times a day (BID) | ORAL | 0 refills | Status: AC
  Filled 2021-11-29: qty 56, 28d supply, fill #0

## 2021-11-29 MED ORDER — METHOCARBAMOL 500 MG PO TABS
500.0000 mg | ORAL_TABLET | Freq: Four times a day (QID) | ORAL | 0 refills | Status: DC | PRN
  Filled 2021-11-29: qty 40, 10d supply, fill #0

## 2021-11-29 NOTE — Progress Notes (Signed)
Physical Therapy Treatment Patient Details Name: Chris Hayes MRN: [id number] DOB: 1960-12-20 Today's Date: 11/29/2021   History of Present Illness 61 yo male sustained fall , resulting in left comminuted intertrochanteric femur fracture, S/P INTRAMEDULLARY (IM) NAIL INTERTROCHANTRIC on 11/27/2021    PT Comments    POD # 2 L IM Nail Pt is AxO x 3 very pleasant and cooperative but his pain is NOT controlled.  Pt was given 1 mg Morphine at 7:27 am then 1 tablet HYDROcodone just prior to Physical Therapy.  Pt OOB in recliner.  Required + 2 assist to stand.  Took Orthostatic vitals as pt was dizzy with OT earlier.   Supine       BP 112/66    HR 92 Sitting        BP  97/69     HR 96 Standing    BP 111/69    HR 96  mild dizziness/MAX pain 3 min         BP   98/68    HR 91  Max dizziness/MAX pain General Gait Details: VERY limited amb ability due to increased pain 10/10 only tolerated forward mobility 2 feet.  Increased c/o dizziness "I'm going to pass out" from pain. Recliner brought to pt.  Stand to sit, pt yelling out in pain.  Positioned in recliner semi reclined.  Instructed/educated on AP, knee presses and gluteal squeezes (freq/often throughout the day) to decrease edema and increase circulation.  ICE also applied and Educated. Pt did NOT meet his mobility goals to safely D/C to home today.    Recommendations for follow up therapy are one component of a multi-disciplinary discharge planning process, led by the attending physician.  Recommendations may be updated based on patient status, additional functional criteria and insurance authorization.  Follow Up Recommendations  Home health PT     Assistance Recommended at Discharge Frequent or constant Supervision/Assistance  Patient can return home with the following A little help with walking and/or transfers;A little help with bathing/dressing/bathroom;Help with stairs or ramp for entrance;Assist for transportation;Assistance with  cooking/housework   Equipment Recommendations  Rolling walker (2 wheels)    Recommendations for Other Services       Precautions / Restrictions Precautions Precautions: Fall Restrictions Weight Bearing Restrictions: Yes LLE Weight Bearing: Partial weight bearing LLE Partial Weight Bearing Percentage or Pounds: 50%     Mobility  Bed Mobility               General bed mobility comments: OOB in recliner    Transfers Overall transfer level: Needs assistance Equipment used: Rolling walker (2 wheels) Transfers: Sit to/from Stand Sit to Stand: Mod assist, Max assist, +2 physical assistance, +2 safety/equipment           General transfer comment: increased time and 50% VC's on proper hand placement, B UE "push up" from recliner and increase use/assist from R LE.  Pt struggled to rise due to increased pain 5/10 at rest then 10/10 with activity.    Ambulation/Gait Ambulation/Gait assistance: Mod assist, +2 physical assistance, +2 safety/equipment Gait Distance (Feet): 2 Feet Assistive device: Rolling walker (2 wheels) Gait Pattern/deviations: Step-to pattern, Decreased stance time - left Gait velocity: decreased     General Gait Details: VERY limited amb ability due to increased pain 10/10 only tolerated forward mobility 2 feet.  Increased c/o dizziness "I'm going to pass out" from pain.   Stairs             Psychologist, prison and probation services  Modified Rankin (Stroke Patients Only)       Balance                                            Cognition Arousal/Alertness: Awake/alert Behavior During Therapy: WFL for tasks assessed/performed Overall Cognitive Status: Within Functional Limits for tasks assessed                                 General Comments: AxO x 3 very pleasant but limted due to icreased pain level with activity        Exercises      General Comments        Pertinent Vitals/Pain Pain Assessment Pain  Score: 10-Worst pain ever Pain Location: left hip with movement Pain Descriptors / Indicators: Aching, Guarding, Grimacing, Moaning, Discomfort, Operative site guarding Pain Intervention(s): Limited activity within patient's tolerance, Monitored during session, Ice applied, Repositioned    Home Living                          Prior Function            PT Goals (current goals can now be found in the care plan section) Progress towards PT goals: Progressing toward goals    Frequency    Min 6X/week      PT Plan Current plan remains appropriate    Co-evaluation              AM-PAC PT "6 Clicks" Mobility   Outcome Measure  Help needed turning from your back to your side while in a flat bed without using bedrails?: A Lot Help needed moving from lying on your back to sitting on the side of a flat bed without using bedrails?: A Lot Help needed moving to and from a bed to a chair (including a wheelchair)?: A Lot Help needed standing up from a chair using your arms (e.g., wheelchair or bedside chair)?: A Lot Help needed to walk in hospital room?: Total Help needed climbing 3-5 steps with a railing? : Total 6 Click Score: 10    End of Session Equipment Utilized During Treatment: Gait belt Activity Tolerance: Patient limited by pain Patient left: in bed;with call bell/phone within reach;with family/visitor present Nurse Communication: Mobility status PT Visit Diagnosis: Unsteadiness on feet (R26.81);Muscle weakness (generalized) (M62.81);Difficulty in walking, not elsewhere classified (R26.2);Pain Pain - Right/Left: Left Pain - part of body: Hip     Time: 2355-7322 PT Time Calculation (min) (ACUTE ONLY): 31 min  Charges:  $Gait Training: 8-22 mins $Therapeutic Activity: 8-22 mins                     {Asuncion Tapscott  PTA Acute  [employer] M-F          [phone] Weekend pager [phone]

## 2021-11-29 NOTE — TOC Transition Note (Signed)
Transition of Care [facility]) - CM/SW Discharge Note   Patient Details  Name: Chris Hayes MRN: [id number] Date of Birth: 07/25/1960  Transition of Care [facility]) CM/SW Contact:  Lanier Clam, RN Phone Number: 11/29/2021, 11:29 AM   Clinical Narrative: No further CM needs.      Final next level of care: Home w Home Health Services Barriers to Discharge: No Barriers Identified   Patient Goals and CMS Choice Patient states their goals for this hospitalization and ongoing recovery are:: Home CMS Medicare.gov Compare Post Acute Care list provided to:: Patient Choice offered to / list presented to : Patient  Discharge Placement                       Discharge Plan and Services   Discharge Planning Services: CM Consult            DME Arranged: Dan Humphreys rolling DME Agency: [facility] Date DME Agency Contacted: 11/29/21 Time DME Agency Contacted: [phone] Representative spoke with at DME Agency: Duwayne Heck HH Arranged: OT HH Agency: [facility] Date HH Agency Contacted: 11/29/21 Time HH Agency Contacted: [phone] Representative spoke with at [facility] Agency: Morrie Sheldon  Social Determinants of Health (SDOH) Interventions     Readmission Risk Interventions     No data to display

## 2021-11-29 NOTE — Progress Notes (Signed)
Occupational Therapy Treatment Patient Details Name: Chris Hayes MRN: [id number] DOB: 1961/03/26 Today's Date: 11/29/2021   History of present illness 61 yo male sustained fall , resulting in left comminuted intertrochanteric femur fracture, S/P INTRAMEDULLARY (IM) NAIL INTERTROCHANTRIC on 11/27/2021   OT comments  This 61 yo male admitted and underwent above seen today with intent to have him practice with AE for LBD; however with increased time to get to EOB, walk 5 feet and sit in recliner; as well as currently he cannot lift his LLE off of the floor on his own. I did demonstrate how AE works to pt and wife. After walking 5 feet with me he reported he was dizzy and "I'm going to pass out"--his color changed to more white and speech slowed down, but he never went out (in recliner with legs elevated ). We will continue to follow, pt no ready to go home today from OT standpoint not only due to he doesn't have appropriate DME at his house, but also he is not tolerating much activity and still needs increased assist for all mobility.   Recommendations for follow up therapy are one component of a multi-disciplinary discharge planning process, led by the attending physician.  Recommendations may be updated based on patient status, additional functional criteria and insurance authorization.    Follow Up Recommendations  Home health OT    Assistance Recommended at Discharge Frequent or constant Supervision/Assistance  Patient can return home with the following  A lot of help with bathing/dressing/bathroom;Assistance with cooking/housework;Assist for transportation;Help with stairs or ramp for entrance   Equipment Recommendations  None recommended by OT       Precautions / Restrictions Precautions Precautions: Fall Restrictions Weight Bearing Restrictions: Yes LLE Weight Bearing: Partial weight bearing LLE Partial Weight Bearing Percentage or Pounds: 50       Mobility Bed  Mobility Overal bed mobility: Needs Assistance Bed Mobility: Supine to Sit     Supine to sit: Mod assist     General bed mobility comments: HOB flat, no rail, VCs for sequencing and A for LLE    Transfers Overall transfer level: Needs assistance Equipment used: Rolling walker (2 wheels) Transfers: Sit to/from Stand Sit to Stand: Min assist, From elevated surface           General transfer comment: Bed at home is elevated     Balance Overall balance assessment: Needs assistance Sitting-balance support: No upper extremity supported, Feet supported Sitting balance-Leahy Scale: Fair     Standing balance support: Bilateral upper extremity supported, During functional activity, Reliant on assistive device for balance Standing balance-Leahy Scale: Poor Standing balance comment: heavy reliance on RW                           ADL either performed or assessed with clinical judgement   ADL Overall ADL's : Needs assistance/impaired                       Lower Body Dressing Details (indicate cue type and reason): Educated wife and pt on use of AE for LBD and sent wife links on [place] for the specific ones that pt needs due to his height and size of his feet. Toilet Transfer: Moderate assistance Toilet Transfer Details (indicate cue type and reason): ambulated 5 feet and recliner brought up behind him                Extremity/Trunk Assessment Upper  Extremity Assessment Upper Extremity Assessment: Overall WFL for tasks assessed            Vision Patient Visual Report: No change from baseline            Cognition Arousal/Alertness: Awake/alert Behavior During Therapy: WFL for tasks assessed/performed Overall Cognitive Status: Within Functional Limits for tasks assessed                                                     Pertinent Vitals/ Pain       Pain Assessment Pain Assessment: 0-10 Pain Score: 8  Pain Location:  left hip with movement Pain Descriptors / Indicators: Aching, Guarding, Grimacing, Moaning, Discomfort Pain Intervention(s): Limited activity within patient's tolerance, Monitored during session, Premedicated before session, Ice applied         Frequency  Min 2X/week        Progress Toward Goals  OT Goals(current goals can now be found in the care plan section)  Progress towards OT goals: Progressing toward goals (slowly)  Acute Rehab OT Goals Patient Stated Goal: to continue to be able to move better Time For Goal Achievement: 12/12/21 Potential to Achieve Goals: Good  Plan Discharge plan needs to be updated       AM-PAC OT "6 Clicks" Daily Activity     Outcome Measure   Help from another person eating meals?: None Help from another person taking care of personal grooming?: A Little Help from another person toileting, which includes using toliet, bedpan, or urinal?: A Lot Help from another person bathing (including washing, rinsing, drying)?: A Lot Help from another person to put on and taking off regular upper body clothing?: A Little Help from another person to put on and taking off regular lower body clothing?: Total 6 Click Score: 15    End of Session Equipment Utilized During Treatment: Gait belt;Rolling walker (2 wheels)  OT Visit Diagnosis: Unsteadiness on feet (R26.81);Other abnormalities of gait and mobility (R26.89);Pain Pain - Right/Left: Left Pain - part of body: Leg   Activity Tolerance Patient tolerated treatment well (despite pain)   Patient Left in chair;with call bell/phone within reach;with chair alarm set;with family/visitor present   Nurse Communication  (pt needs to stay one more night since his 3n1 will not arrive until tomorow and his toilet at home is too short for his tall stature)        Time: 1005-1055 OT Time Calculation (min): 50 min  Charges: OT General Charges $OT Visit: 1 Visit OT Treatments $Self Care/Home Management : 38-52  mins  Ignacia Palma, OTR/L Acute Rehab Services [facility] [phone] Office [phone]    Evette Georges 11/29/2021, 11:33 AM

## 2021-11-29 NOTE — TOC Transition Note (Signed)
Transition of Care [facility]) - CM/SW Discharge Note   Patient Details  Name: Chris Hayes MRN: [id number] Date of Birth: 06/08/1961  Transition of Care [facility]) CM/SW Contact:  Lanier Clam, RN Phone Number: 11/29/2021, 9:59 AM   Clinical Narrative: d/c today see below. All HHC,dme set. Crutches should come from the floor(therapy). No further CM needs.      Final next level of care:  (TBD) Barriers to Discharge: No Barriers Identified   Patient Goals and CMS Choice Patient states their goals for this hospitalization and ongoing recovery are:: Home CMS Medicare.gov Compare Post Acute Care list provided to:: Patient Choice offered to / list presented to : Patient  Discharge Placement                       Discharge Plan and Services   Discharge Planning Services: CM Consult            DME Arranged: Dan Humphreys rolling DME Agency: [facility] Date DME Agency Contacted: 11/29/21 Time DME Agency Contacted: [phone] Representative spoke with at DME Agency: Duwayne Heck HH Arranged: PT HH Agency: [facility] Date HH Agency Contacted: 11/29/21 Time HH Agency Contacted: [phone] Representative spoke with at [facility] Agency: Morrie Sheldon  Social Determinants of Health (SDOH) Interventions     Readmission Risk Interventions     No data to display

## 2021-11-29 NOTE — Discharge Summary (Signed)
PatientPhysician Discharge Summary  Chris Hayes HDQ:[id number] DOB: August 19, 1960 DOA: 11/27/2021  PCP: Emilio Aspen, MD  Admit date: 11/27/2021 Discharge date: 11/29/2021 30 Day Unplanned Readmission Risk Score    Flowsheet Row ED to Hosp-Admission (Current) from 11/27/2021 in [place] 4TH FLOOR PROGRESSIVE CARE AND UROLOGY  30 Day Unplanned Readmission Risk Score (%) 10.25 Filed at 11/29/2021 0801       This score is the patient's risk of an unplanned readmission within 30 days of being discharged (0 -100%). The score is based on dignosis, age, lab data, medications, orders, and past utilization.   Low:  0-14.9   Medium: 15-21.9   High: 22-29.9   Extreme: 30 and above          Admitted From: Home Disposition: Home  Recommendations for Outpatient Follow-up:  Follow up with PCP in 1-2 weeks Please obtain BMP/CBC in one week Follow-up with orthopedics in 2 weeks Please follow up with your PCP on the following pending results: Unresulted Labs (From admission, onward)    None         Home Health: Yes Equipment/Devices: Rolling walker/2 wheels  Discharge Condition: Stable CODE STATUS: Full code Diet recommendation: Cardiac  Subjective: Seen and examined.  Doing well.  Pain controlled.  He is looking forward to going home today.  Brief/Interim Summary: 61 y.o. male with medical history significant of atrial fibrillation status post ablation, currently not on anticoagulation, hypertension who presented to the emergency department following injury to the hip after falling off stool while screwing something, causing pt to fall to the ground.  Patient had marked pain.  Patient subsequently presented to the emergency department and based on the further work-up, he was found to have acute comminuted intra-articular fracture deformity involving the proximal left femur.  Admitted under hospitalist service.  Orthopedic surgery consulted.  Patient underwent intramedullary  nail intertrochanteric left by orthopedics on 11/27/2021.  Seen by PT OT post surgery, doing well, they recommended home health PT OT.  Patient has been cleared by orthopedics.  They recommended aspirin twice daily for DVT prophylaxis.  Patient doing well and is being discharged stable condition.     Discharge plan was discussed with patient and/or family member and they verbalized understanding and agreed with it.  Discharge Diagnoses:  Principal Problem:   Hip fracture [facility]) Active Problems:   Essential hypertension    Discharge Instructions  Discharge Instructions     Call MD / Call 911   Complete by: As directed    If you experience chest pain or shortness of breath, CALL 911 and be transported to the hospital emergency room.  If you develope a fever above 101 F, pus (white drainage) or increased drainage or redness at the wound, or calf pain, call your surgeon's office.   Change dressing   Complete by: As directed    Maintain surgical dressing until follow up in the clinic. If the edges start to pull up, may reinforce with tape. If the dressing is no longer working, may remove and cover with gauze and tape, but must keep the area dry and clean.  Call with any questions or concerns.   Constipation Prevention   Complete by: As directed    Drink plenty of fluids.  Prune juice may be helpful.  You may use a stool softener, such as Colace (over the counter) 100 mg twice a day.  Use MiraLax (over the counter) for constipation as needed.   Diet - low sodium heart healthy  Complete by: As directed    Increase activity slowly as tolerated   Complete by: As directed    Partial weight bearing with assist device as directed.   Post-operative opioid taper instructions:   Complete by: As directed    POST-OPERATIVE OPIOID TAPER INSTRUCTIONS: It is important to wean off of your opioid medication as soon as possible. If you do not need pain medication after your surgery it is ok to stop day  one. Opioids include: Codeine, Hydrocodone(Norco, Vicodin), Oxycodone(Percocet, oxycontin) and hydromorphone amongst others.  Long term and even short term use of opiods can cause: Increased pain response Dependence Constipation Depression Respiratory depression And more.  Withdrawal symptoms can include Flu like symptoms Nausea, vomiting And more Techniques to manage these symptoms Hydrate well Eat regular healthy meals Stay active Use relaxation techniques(deep breathing, meditating, yoga) Do Not substitute Alcohol to help with tapering If you have been on opioids for less than two weeks and do not have pain than it is ok to stop all together.  Plan to wean off of opioids This plan should start within one week post op of your joint replacement. Maintain the same interval or time between taking each dose and first decrease the dose.  Cut the total daily intake of opioids by one tablet each day Next start to increase the time between doses. The last dose that should be eliminated is the evening dose.      TED hose   Complete by: As directed    Use stockings (TED hose) for 2 weeks on both leg(s).  You may remove them at night for sleeping.      Allergies as of 11/29/2021   No Known Allergies      Medication List     STOP taking these medications    cephALEXin 500 MG capsule Commonly known as: KEFLEX   HYDROcodone-acetaminophen 10-325 MG tablet Commonly known as: NORCO Replaced by: HYDROcodone-acetaminophen 7.5-325 MG tablet       TAKE these medications    aspirin 81 MG chewable tablet Commonly known as: Aspirin Childrens Chew 1 tablet (81 mg total) by mouth in the morning and at bedtime for 28 days.   HYDROcodone-acetaminophen 7.5-325 MG tablet Commonly known as: NORCO Take 1 tablet by mouth every 4 (four) hours as needed for severe pain (pain score 7-10). Replaces: HYDROcodone-acetaminophen 10-325 MG tablet   losartan 50 MG tablet Commonly known as:  COZAAR Take 1 tablet by mouth daily.   methocarbamol 500 MG tablet Commonly known as: ROBAXIN Take 1 tablet (500 mg total) by mouth every 6 (six) hours as needed for muscle spasms.   metoprolol succinate 50 MG 24 hr tablet Commonly known as: TOPROL-XL Take 1 tablet by mouth once daily. What changed: how much to take               Durable Medical Equipment  (From admission, onward)           Start     Ordered   11/28/21 1535  For home use only DME Walker rolling  Once       Question Answer Comment  Walker: With [place] Wheels   Patient needs a walker to treat with the following condition Unsteady gait      11/28/21 1534   11/28/21 1353  For home use only DME Walker rolling  Once       Question Answer Comment  Walker: With [place]   Patient needs a walker to treat with  the following condition Unsteady gait      11/28/21 1352              Discharge Care Instructions  (From admission, onward)           Start     Ordered   11/29/21 0000  Change dressing       Comments: Maintain surgical dressing until follow up in the clinic. If the edges start to pull up, may reinforce with tape. If the dressing is no longer working, may remove and cover with gauze and tape, but must keep the area dry and clean.  Call with any questions or concerns.   11/29/21 0836            Follow-up Information     Durene Romans, MD. Schedule an appointment as soon as possible for a visit in 2 week(s).   Specialty: Orthopedic Surgery Contact information: [address] [place] [address] [phone]         [facility] Follow up.   Why: HH physical therapy Contact information: [address] [place] [address] [phone]         [facility] Follow up.   Why: rolling walker Contact information: [address] [place] [address] [phone]         Emilio Aspen, MD  Follow up in 1 week(s).   Specialty: Internal Medicine Contact information: [address] [place] [address] [phone]         Quintella Reichert, MD .   Specialty: Cardiology Contact information: [phone] [address] [place] [address] [phone]                No Known Allergies  Consultations: Orthopedics   Procedures/Studies: DG FEMUR MIN 2 VIEWS LEFT  Result Date: 11/27/2021 CLINICAL DATA:  Left hip fracture EXAM: LEFT FEMUR 2 VIEWS COMPARISON:  Film from earlier in the same day. FLUOROSCOPY TIME:  Radiation Exposure Index (as provided by the fluoroscopic device): 10.08 mGy If the device does not provide the exposure index: Fluoroscopy Time:  36 seconds Number of Acquired Images:  2 FINDINGS: Proximal left femoral medullary rod is seen with fixation screw traversing the femoral neck. Previously noted intertrochanteric fracture has been reduced. IMPRESSION: ORIF of left proximal femoral fracture. Electronically Signed   By: Alcide Clever M.D.   On: 11/27/2021 19:53   DG C-Arm 1-60 Min-No Report  Result Date: 11/27/2021 Fluoroscopy was utilized by the requesting physician.  No radiographic interpretation.   DG Hip Unilat With Pelvis 2-3 Views Left  Result Date: 11/27/2021 CLINICAL DATA:  Fall from ladder EXAM: DG HIP (WITH OR WITHOUT PELVIS) 2-3V LEFT COMPARISON:  None Available. FINDINGS: There is an acute, comminuted, intra-articular fracture deformity involving the proximal left femur. The fracture fragments are in near anatomic alignment. No signs of dislocation. IMPRESSION: Acute, comminuted, intra-articular fracture deformity involving the proximal left femur. Electronically Signed   By: Signa Kell M.D.   On: 11/27/2021 15:37     Discharge Exam: Vitals:   11/29/21 0504 11/29/21 0844  BP: 111/70 128/76  Pulse: 78 75  Resp: 20   Temp: 98.2 F (36.8 C)   SpO2: 92%    Vitals:   11/28/21 1242 11/28/21 2039 11/29/21 0504  11/29/21 0844  BP: 111/61 126/72 111/70 128/76  Pulse: 75 99 78 75  Resp: 18 20 20    Temp: 98.3 F (36.8 C)  63 F (36.7 C) 98.2 F (36.8 C)   TempSrc: Oral  Oral   SpO2: 98% 93% 92%   Weight:      Height:        General: Pt is alert, awake, not in acute distress Cardiovascular: RRR, S1/S2 +, no rubs, no gallops Respiratory: CTA bilaterally, no wheezing, no rhonchi Abdominal: Soft, NT, ND, bowel sounds + Extremities: no edema, no cyanosis    The results of significant diagnostics from this hospitalization (including imaging, microbiology, ancillary and laboratory) are listed below for reference.     Microbiology: Recent Results (from the past 240 hour(s))  Surgical pcr screen     Status: None   Collection Time: 11/27/21  5:46 PM   Specimen: Nasal Mucosa; Nasal Swab  Result Value Ref Range Status   MRSA, PCR NEGATIVE NEGATIVE Final   Staphylococcus aureus NEGATIVE NEGATIVE Final    Comment: (NOTE) The Xpert SA Assay (FDA approved for NASAL specimens in patients 67 years of age and older), is one component of a comprehensive surveillance program. It is not intended to diagnose infection nor to guide or monitor treatment. Performed at [facility], [place] [address] [place], [place] [address]      Labs: BNP (last 3 results) No results for input(s): "BNP" in the last 8760 hours. Basic Metabolic Panel: Recent Labs  Lab 11/27/21 1410 11/28/21 0458 11/29/21 0549  NA 144 141  141 144  K 3.6 4.1  4.1 3.8  CL 112* 109  109 113*  CO2 25 24  25 26   GLUCOSE 99 178*  178* 121*  BUN 11 12  12 16   CREATININE 0.84 1.02  1.06 0.83  CALCIUM 9.1 8.8*  8.8* 8.2*   Liver Function Tests: Recent Labs  Lab 11/28/21 0458 11/29/21 0549  AST 31 22  ALT 27 20  ALKPHOS 64 57  BILITOT 1.2 0.9  PROT 6.2* 5.4*  ALBUMIN 3.6 3.1*   No results for input(s): "LIPASE", "AMYLASE" in the last 168 hours. No results for input(s): "AMMONIA" in the last 168  hours. CBC: Recent Labs  Lab 11/27/21 1410 11/28/21 0458 11/29/21 0549  WBC 9.6 9.2  8.8 11.9*  NEUTROABS 7.6  --   --   HGB 14.9 13.0  13.1 11.7*  HCT 44.5 39.6  40.0 35.4*  MCV 90.6 92.3  92.6 92.7  PLT 189 180  177 144*   Cardiac Enzymes: No results for input(s): "CKTOTAL", "CKMB", "CKMBINDEX", "TROPONINI" in the last 168 hours. BNP: Invalid input(s): "POCBNP" CBG: No results for input(s): "GLUCAP" in the last 168 hours. D-Dimer No results for input(s): "DDIMER" in the last 72 hours. Hgb A1c No results for input(s): "HGBA1C" in the last 72 hours. Lipid Profile No results for input(s): "CHOL", "HDL", "LDLCALC", "TRIG", "CHOLHDL", "LDLDIRECT" in the last 72 hours. Thyroid function studies No results for input(s): "TSH", "T4TOTAL", "T3FREE", "THYROIDAB" in the last 72 hours.  Invalid input(s): "FREET3" Anemia work up No results for input(s): "VITAMINB12", "FOLATE", "FERRITIN", "TIBC", "IRON", "RETICCTPCT" in the last 72 hours. Urinalysis No results found for: "COLORURINE", "APPEARANCEUR", "LABSPEC", "PHURINE", "GLUCOSEU", "HGBUR", "BILIRUBINUR", "KETONESUR", "PROTEINUR", "UROBILINOGEN", "NITRITE", "LEUKOCYTESUR" Sepsis Labs Recent Labs  Lab 11/27/21 1410 11/28/21 0458 11/29/21 0549  WBC 9.6 9.2  8.8 11.9*   Microbiology Recent Results (from the past 240 hour(s))  Surgical pcr screen     Status: None   Collection Time: 11/27/21  5:46 PM   Specimen: Nasal Mucosa; Nasal Swab  Result Value Ref Range Status  MRSA, PCR NEGATIVE NEGATIVE Final   Staphylococcus aureus NEGATIVE NEGATIVE Final    Comment: (NOTE) The Xpert SA Assay (FDA approved for NASAL specimens in patients 58 years of age and older), is one component of a comprehensive surveillance program. It is not intended to diagnose infection nor to guide or monitor treatment. Performed at [facility], [place] [address] [place], [place] [address]      Time coordinating discharge:  Over 30 minutes  SIGNED:   Darliss Cheney, MD  [facility] 11/29/2021, 9:04 AM *Please note that this is a verbal dictation therefore any spelling or grammatical errors are due to the "[place] [employer]" system interpretation. If 7PM-7AM, please contact night-coverage [email or web address]

## 2021-11-29 NOTE — Progress Notes (Signed)
Physical Therapy Treatment Patient Details Name: Chris Hayes MRN: [id number] DOB: 1960/12/20 Today's Date: 11/29/2021   History of Present Illness 61 yo male sustained fall , resulting in left comminuted intertrochanteric femur fracture, S/P INTRAMEDULLARY (IM) NAIL INTERTROCHANTRIC on 11/27/2021    PT Comments    POD # 2 pm session Pt back in bed so performed TE's.  Pt already in place gait belt around L foot "help me move".  Pt tolerated AP, knee presses, SAQ's and HS.  He was unable to tolerate ABD/ADD.  Educated pt on pains meds: Norco Q4, Tylenol Q6 and Robaxin Q6.  Discussed "staying a head of the pain".  Also instructed to continue to use ICE packs.   Pt plans to D/C to home and Spouse plans to drive car around back of house to basement access (no stairs) where there is a full bed, recliner chair, full bathroom with walk in shower.  Spouse purchased a 3:1 through [employer].  Pt will need a RW ordered.  Also advised him to take urinals homes.   Will see pt in am after pain meds to attempt mobility > 20 feet he has to enter his home.    Recommendations for follow up therapy are one component of a multi-disciplinary discharge planning process, led by the attending physician.  Recommendations may be updated based on patient status, additional functional criteria and insurance authorization.  Follow Up Recommendations  Home health PT     Assistance Recommended at Discharge Frequent or constant Supervision/Assistance  Patient can return home with the following A little help with walking and/or transfers;A little help with bathing/dressing/bathroom;Help with stairs or ramp for entrance;Assist for transportation;Assistance with cooking/housework   Equipment Recommendations  Rolling walker (2 wheels)    Recommendations for Other Services       Precautions / Restrictions Precautions Precautions: Fall Restrictions Weight Bearing Restrictions: Yes LLE Weight Bearing: Partial weight  bearing LLE Partial Weight Bearing Percentage or Pounds: 50%           Cognition Arousal/Alertness: Awake/alert Behavior During Therapy: WFL for tasks assessed/performed Overall Cognitive Status: Within Functional Limits for tasks assessed                                 General Comments: AxO x 3 very pleasant but limted due to icreased pain level with activity        Exercises  20 reps AP  05 reps knee presses  05 reps HS  05 reps SAQ's Followed by ICE    General Comments        Pertinent Vitals/Pain Pain Assessment Pain Score: 10-Worst pain ever Pain Location: left hip with movement Pain Descriptors / Indicators: Aching, Guarding, Grimacing, Moaning, Discomfort, Operative site guarding Pain Intervention(s): Limited activity within patient's tolerance, Monitored during session, Ice applied, Repositioned    Home Living                          Prior Function            PT Goals (current goals can now be found in the care plan section) Progress towards PT goals: Progressing toward goals    Frequency    Min 6X/week      PT Plan Current plan remains appropriate    Co-evaluation              AM-PAC PT "6 Clicks" Mobility  Outcome Measure  Help needed turning from your back to your side while in a flat bed without using bedrails?: A Lot Help needed moving from lying on your back to sitting on the side of a flat bed without using bedrails?: A Lot Help needed moving to and from a bed to a chair (including a wheelchair)?: A Lot Help needed standing up from a chair using your arms (e.g., wheelchair or bedside chair)?: A Lot Help needed to walk in hospital room?: Total Help needed climbing 3-5 steps with a railing? : Total 6 Click Score: 10    End of Session Equipment Utilized During Treatment: Gait belt Activity Tolerance: Patient limited by pain Patient left: in bed Nurse Communication: Mobility status PT Visit Diagnosis:  Unsteadiness on feet (R26.81);Muscle weakness (generalized) (M62.81);Difficulty in walking, not elsewhere classified (R26.2);Pain Pain - Right/Left: Left Pain - part of body: Hip     Time: 0300-9233 PT Time Calculation (min) (ACUTE ONLY): 18 min  Charges:   $Therapeutic Exercise: 8-22 mins                      Felecia Shelling  PTA Acute  Rehabilitation Services Office M-F          [phone] Weekend pager [phone]

## 2021-11-29 NOTE — Progress Notes (Signed)
Plan was to discharge the patient home today with home health PT.  The discharge work including DC summary was completed and then I received a message from PT OT as following "Pt was +1 Mod A for me for OOB this AM with min A to ambulate 5 feet (at which point he said he was going to pass out--he color went dull and his speech slowed down, but he never fully went out). He cannot go home today since his 3n1 will not be delivered to his home until tomorrow. He had to order a special one from [place] yesterday, because the one that he qualifies for through his insurance here would be too narrow for him and not elongated enough to meet his height and build and his commode at home is a standard height". Per their recommendations, discharge is being canceled.  He will be discharged tomorrow.

## 2021-11-29 NOTE — Progress Notes (Signed)
   Subjective: 2 Days Post-Op Procedure(s) (LRB): INTRAMEDULLARY (IM) NAIL INTERTROCHANTRIC (Left) Patient reports pain as mild.   Patient seen in rounds by Dr. Charlann Boxer. Patient is resting in bed on exam this morning. No acute events overnight.  We will start therapy today.   Objective: Vital signs in last 24 hours: Temp:  [98 F (36.7 C)-98.3 F (36.8 C)] 98.2 F (36.8 C) (06/21 0504) Pulse Rate:  [75-99] 78 (06/21 0504) Resp:  [18-20] 20 (06/21 0504) BP: (111-126)/(61-72) 111/70 (06/21 0504) SpO2:  [92 %-98 %] 92 % (06/21 0504)  Intake/Output from previous day:  Intake/Output Summary (Last 24 hours) at 11/29/2021 0829 Last data filed at 11/29/2021 0500 Gross per 24 hour  Intake 180 ml  Output 2370 ml  Net -2190 ml     Intake/Output this shift: No intake/output data recorded.  Labs: Recent Labs    11/27/21 1410 11/28/21 0458 11/29/21 0549  HGB 14.9 13.0  13.1 11.7*   Recent Labs    11/28/21 0458 11/29/21 0549  WBC 9.2  8.8 11.9*  RBC 4.29  4.32 3.82*  HCT 39.6  40.0 35.4*  PLT 180  177 144*   Recent Labs    11/28/21 0458 11/29/21 0549  NA 141  141 144  K 4.1  4.1 3.8  CL 109  109 113*  CO2 24  25 26   BUN 12  12 16   CREATININE 1.02  1.06 0.83  GLUCOSE 178*  178* 121*  CALCIUM 8.8*  8.8* 8.2*   Recent Labs    11/27/21 1410  INR 1.0    Exam: General - Patient is Alert and Oriented Extremity - Neurologically intact Sensation intact distally Intact pulses distally Dorsiflexion/Plantar flexion intact Dressing - dressing C/D/I Motor Function - intact, moving foot and toes well on exam.   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   Weight as of this encounter: 108.9 kg. Advance diet Up with therapy  DVT Prophylaxis - Aspirin PWB 50% LLE.   Hgb stable at 11.7 this AM.  Patient is clear for discharge from orthopaedic standpoint whenever medically stable.   Plan is to go Home after hospital stay. Plan for discharge today after meeting goals with therapy. Follow up in the office in 2 weeks.   , PA-C Orthopedic Surgery [phone] 11/29/2021, 8:29 AM

## 2021-11-30 NOTE — Progress Notes (Signed)
Physical Therapy Treatment Patient Details Name: Chris Hayes MRN: [id number] DOB: [birth year] Today's Date: 11/30/2021   History of Present Illness 61 yo male sustained fall , resulting in left comminuted intertrochanteric femur fracture, S/P INTRAMEDULLARY (IM) NAIL INTERTROCHANTRIC on 11/27/2021    PT Comments    POD # 3 am session Pt NOT progressing due to pain.  Assisted OOB to amb was an effort. General bed mobility comments: increased, increased time using his belt to self assist LE offbed.  Nearly 6 min to complete.  Reports 9/10 pain. General transfer comment: required + 2 assist even from an elevated bed with much effort limited by L hip/thigh pain.  Pt moaning in bed. General Gait Details: VERY limited amb ability due to increased pain 10/10 only tolerated forward mobility 4 feet.  NO c/o dizziness this session. Recliner following for safety.  Sit to stand and stand to sit is very difficult for pt to tolerate. Positioned in recliner.  Instructed on AP, knee presses and gluteal squeezes.  ICE.     Recommendations for follow up therapy are one component of a multi-disciplinary discharge planning process, led by the attending physician.  Recommendations may be updated based on patient status, additional functional criteria and insurance authorization.  Follow Up Recommendations  Home health PT     Assistance Recommended at Discharge Frequent or constant Supervision/Assistance  Patient can return home with the following A little help with walking and/or transfers;A little help with bathing/dressing/bathroom;Help with stairs or ramp for entrance;Assist for transportation;Assistance with cooking/housework   Equipment Recommendations  Rolling walker (2 wheels)    Recommendations for Other Services       Precautions / Restrictions Precautions Precautions: Fall Restrictions Weight Bearing Restrictions: Yes LLE Weight Bearing: Partial weight bearing LLE Partial Weight  Bearing Percentage or Pounds: 50%     Mobility  Bed Mobility Overal bed mobility: Needs Assistance Bed Mobility: Supine to Sit     Supine to sit: Mod assist, Max assist     General bed mobility comments: increased, increased time using his belt to self assist LE offbed.  Nearly 6 min to complete.  Reports 9/10 pain.    Transfers Overall transfer level: Needs assistance Equipment used: Rolling walker (2 wheels) Transfers: Sit to/from Stand Sit to Stand: Min assist, Mod assist, +2 safety/equipment           General transfer comment: required + 2 assist even from an elevated bed with much effort limited by L hip/thigh pain.  Pt moaning in bed.    Ambulation/Gait Ambulation/Gait assistance: Min assist, Mod assist Gait Distance (Feet): 4 Feet Assistive device: Rolling walker (2 wheels) Gait Pattern/deviations: Step-to pattern, Decreased stance time - left Gait velocity: decreased     General Gait Details: VERY limited amb ability due to increased pain 10/10 only tolerated forward mobility 4 feet.  NO c/o dizziness this session.   Stairs             Wheelchair Mobility    Modified Rankin (Stroke Patients Only)       Balance                                            Cognition Arousal/Alertness: Awake/alert Behavior During Therapy: WFL for tasks assessed/performed Overall Cognitive Status: Within Functional Limits for tasks assessed  General Comments: AxO x 3 very pleasant but limted due to icreased pain level with activity        Exercises      General Comments        Pertinent Vitals/Pain Pain Assessment Pain Assessment: 0-10 Pain Score: 10-Worst pain ever Pain Location: left hip, thigh with movement as well as L knee Pain Descriptors / Indicators: Aching, Guarding, Grimacing, Moaning, Discomfort, Operative site guarding Pain Intervention(s): Monitored during session,  Premedicated before session, Repositioned, Ice applied    Home Living                          Prior Function            PT Goals (current goals can now be found in the care plan section) Progress towards PT goals: Progressing toward goals    Frequency    Min 6X/week      PT Plan Current plan remains appropriate    Co-evaluation              AM-PAC PT "6 Clicks" Mobility   Outcome Measure  Help needed turning from your back to your side while in a flat bed without using bedrails?: A Lot Help needed moving from lying on your back to sitting on the side of a flat bed without using bedrails?: A Lot Help needed moving to and from a bed to a chair (including a wheelchair)?: A Lot Help needed standing up from a chair using your arms (e.g., wheelchair or bedside chair)?: A Lot Help needed to walk in hospital room?: A Lot Help needed climbing 3-5 steps with a railing? : Total 6 Click Score: 11    End of Session Equipment Utilized During Treatment: Gait belt Activity Tolerance: Patient limited by pain Patient left: in chair;with call bell/phone within reach;with chair alarm set Nurse Communication: Mobility status PT Visit Diagnosis: Unsteadiness on feet (R26.81);Muscle weakness (generalized) (M62.81);Difficulty in walking, not elsewhere classified (R26.2);Pain Pain - Right/Left: Left Pain - part of body: Hip     Time: 1200-1228 PT Time Calculation (min) (ACUTE ONLY): 28 min  Charges:  $Gait Training: 8-22 mins $Therapeutic Activity: 8-22 mins                    Felecia Shelling  PTA Acute  Rehabilitation Services Office M-F          [phone] Weekend pager [phone]

## 2021-11-30 NOTE — Progress Notes (Addendum)
Physical Therapy Treatment Patient Details Name: Chris Hayes MRN: [id number] DOB: 1961/06/07 Today's Date: 11/30/2021   History of Present Illness 61 yo male sustained fall , resulting in left comminuted intertrochanteric femur fracture, S/P INTRAMEDULLARY (IM) NAIL INTERTROCHANTRIC on 11/27/2021    PT Comments    POD # 3 pm session Spouse present Pt was pre medicated prior to session with: 2 tablets Norco @ 13:26 500 mg Robaxin @ 13:17 650 Tylenol @ 13:19 PT session started at 14:10 PAIN is still limiting his mobility and inability to practice stairs.  Pt unable to self rise from recliner.  General Gait Details: VERY limited amb ability due to increased pain 10/10 only tolerated forward mobility 5 feet.  "I don't feel good, I got to sit down".  Recliner brought to pt.  Spouse present during session and observed.  Educated on proper use of walker and sequencing.  Spouse asking about crutches.  Advised "NO crutches" to use walker all times.  Spouse also Educated on 50% WBing. Stairs:  (unable to attempt stairs due to poor gait ability.  Did demonstarte and Educate on proper tech "up backward" with walker to ensure PWB and down "forward" with walker.  Spouse used her phone to video this demonstartion by Therapist. Pt left in recliner with ICE to hip.  Pt did NOT meet his mobility goals to safely D/C to home today.  Advised RN.  Pt may need stronger oral pain meds to be able to functionally mobilize. Cancel D/C Will see in am     Recommendations for follow up therapy are one component of a multi-disciplinary discharge planning process, led by the attending physician.  Recommendations may be updated based on patient status, additional functional criteria and insurance authorization.  Follow Up Recommendations  Home health PT     Assistance Recommended at Discharge Frequent or constant Supervision/Assistance  Patient can return home with the following A little help with walking and/or  transfers;A little help with bathing/dressing/bathroom;Help with stairs or ramp for entrance;Assist for transportation;Assistance with cooking/housework   Equipment Recommendations  Rolling walker (2 wheels)    Recommendations for Other Services       Precautions / Restrictions Precautions Precautions: Fall Restrictions Weight Bearing Restrictions: Yes LLE Weight Bearing: Partial weight bearing LLE Partial Weight Bearing Percentage or Pounds: 50%     Mobility  Bed Mobility Overal bed mobility: Needs Assistance Bed Mobility: Supine to Sit     Supine to sit: Mod assist, Max assist     General bed mobility comments: OOB in recliner    Transfers Overall transfer level: Needs assistance Equipment used: Rolling walker (2 wheels) Transfers: Sit to/from Stand Sit to Stand: Min assist, Mod assist, +2 safety/equipment           General transfer comment: pt struggled to self rise from recliner, unable on his own.  Assist to rise with 9/10 L hip/thigh pain.    Ambulation/Gait Ambulation/Gait assistance: Min assist, Mod assist Gait Distance (Feet): 5 Feet Assistive device: Rolling walker (2 wheels) Gait Pattern/deviations: Step-to pattern, Decreased stance time - left Gait velocity: decreased     General Gait Details: VERY limited amb ability due to increased pain 10/10 only tolerated forward mobility 5 feet.  Spouse present during session and observed.  Educated on proper use of walker and sequencing.  Spouse asking about crutches.  Advised "NO crutches" to use walker all times.  Spouse also Educated on 50% WBing.   Stairs Stairs:  (unable to attempt stairs due to  poor gait ability.  Did demonstarte and Educate on proper tech "up backward" with walker to ensure PWB and down "forward" with walker.  Spouse used her phone to video this demonstartion by Therapist.)           Wheelchair Mobility    Modified Rankin (Stroke Patients Only)       Balance                                             Cognition Arousal/Alertness: Awake/alert Behavior During Therapy: WFL for tasks assessed/performed Overall Cognitive Status: Within Functional Limits for tasks assessed                                 General Comments: AxO x 3 very pleasant but limted due to icreased pain level with activity        Exercises      General Comments        Pertinent Vitals/Pain Pain Assessment Pain Assessment: 0-10 Pain Score: 10-Worst pain ever Pain Location: left hip, thigh with movement as well as L knee Pain Descriptors / Indicators: Aching, Guarding, Grimacing, Moaning, Discomfort, Operative site guarding Pain Intervention(s): Monitored during session, Premedicated before session, Repositioned, Ice applied    Home Living                          Prior Function            PT Goals (current goals can now be found in the care plan section) Progress towards PT goals: Progressing toward goals    Frequency    Min 6X/week      PT Plan Current plan remains appropriate    Co-evaluation              AM-PAC PT "6 Clicks" Mobility   Outcome Measure  Help needed turning from your back to your side while in a flat bed without using bedrails?: A Lot Help needed moving from lying on your back to sitting on the side of a flat bed without using bedrails?: A Lot Help needed moving to and from a bed to a chair (including a wheelchair)?: A Lot Help needed standing up from a chair using your arms (e.g., wheelchair or bedside chair)?: A Lot Help needed to walk in hospital room?: A Lot Help needed climbing 3-5 steps with a railing? : Total 6 Click Score: 11    End of Session Equipment Utilized During Treatment: Gait belt Activity Tolerance: Patient limited by pain Patient left: in chair;with call bell/phone within reach;with chair alarm set Nurse Communication: Mobility status PT Visit Diagnosis: Unsteadiness on  feet (R26.81);Muscle weakness (generalized) (M62.81);Difficulty in walking, not elsewhere classified (R26.2);Pain Pain - Right/Left: Left Pain - part of body: Hip     Time: 1410-1445 PT Time Calculation (min) (ACUTE ONLY): 35 min  Charges:  $Gait Training: 8-22 mins $Therapeutic Activity: 8-22 mins                    Felecia Shelling  PTA Acute  Rehabilitation Services Office M-F          [phone] Weekend pager [phone]

## 2021-12-01 ENCOUNTER — Other Ambulatory Visit: Payer: Self-pay

## 2021-12-01 DIAGNOSIS — L8990 Pressure ulcer of unspecified site, unspecified stage: Secondary | ICD-10-CM | POA: Insufficient documentation

## 2021-12-01 MED ORDER — ORAL CARE MOUTH RINSE: 15.0000 mL | OROMUCOSAL | Status: DC | PRN

## 2021-12-01 MED ORDER — FLEET ENEMA 7-19 GM/118ML RE ENEM
1.0000 | ENEMA | Freq: Once | RECTAL | Status: AC
Start: 2021-12-01 — End: 2021-12-01
  Administered 2021-12-01: 1 via RECTAL
  Filled 2021-12-01: qty 1

## 2021-12-11 ENCOUNTER — Other Ambulatory Visit: Payer: Self-pay

## 2021-12-11 MED ORDER — HYDROCODONE-ACETAMINOPHEN 7.5-325 MG PO TABS
1.0000 | ORAL_TABLET | Freq: Two times a day (BID) | ORAL | 0 refills | Status: DC
  Filled 2021-12-11: qty 54, 27d supply, fill #0
  Filled 2021-12-31 – 2022-01-01 (×3): qty 6, 3d supply, fill #1

## 2021-12-18 DIAGNOSIS — S72142A Displaced intertrochanteric fracture of left femur, initial encounter for closed fracture: Secondary | ICD-10-CM | POA: Diagnosis not present

## 2021-12-31 ENCOUNTER — Other Ambulatory Visit: Payer: Self-pay | Admitting: Student

## 2022-01-01 ENCOUNTER — Other Ambulatory Visit: Payer: Self-pay

## 2022-01-01 MED ORDER — HYDROCODONE-ACETAMINOPHEN 10-325 MG PO TABS
ORAL_TABLET | ORAL | 0 refills | Status: DC
  Filled 2022-01-01: qty 60, 30d supply, fill #0

## 2022-01-05 ENCOUNTER — Other Ambulatory Visit: Payer: Self-pay

## 2022-01-05 MED ORDER — METHOCARBAMOL 500 MG PO TABS
500.0000 mg | ORAL_TABLET | Freq: Four times a day (QID) | ORAL | 0 refills | Status: DC | PRN
  Filled 2022-01-05: qty 40, 10d supply, fill #0

## 2022-01-11 ENCOUNTER — Other Ambulatory Visit: Payer: Self-pay

## 2022-01-11 DIAGNOSIS — Z0000 Encounter for general adult medical examination without abnormal findings: Secondary | ICD-10-CM | POA: Diagnosis not present

## 2022-01-22 DIAGNOSIS — S72142A Displaced intertrochanteric fracture of left femur, initial encounter for closed fracture: Secondary | ICD-10-CM | POA: Diagnosis not present

## 2022-01-24 ENCOUNTER — Other Ambulatory Visit: Payer: Self-pay

## 2022-01-31 ENCOUNTER — Other Ambulatory Visit: Payer: Self-pay

## 2022-02-01 ENCOUNTER — Other Ambulatory Visit: Payer: Self-pay

## 2022-02-01 MED ORDER — HYDROCODONE-ACETAMINOPHEN 10-325 MG PO TABS
ORAL_TABLET | ORAL | 0 refills | Status: DC
  Filled 2022-02-01: qty 60, 30d supply, fill #0

## 2022-02-06 DIAGNOSIS — S72142D Displaced intertrochanteric fracture of left femur, subsequent encounter for closed fracture with routine healing: Secondary | ICD-10-CM | POA: Diagnosis not present

## 2022-02-13 DIAGNOSIS — S72142D Displaced intertrochanteric fracture of left femur, subsequent encounter for closed fracture with routine healing: Secondary | ICD-10-CM | POA: Diagnosis not present

## 2022-02-16 DIAGNOSIS — S72142D Displaced intertrochanteric fracture of left femur, subsequent encounter for closed fracture with routine healing: Secondary | ICD-10-CM | POA: Diagnosis not present

## 2022-02-20 DIAGNOSIS — S72142D Displaced intertrochanteric fracture of left femur, subsequent encounter for closed fracture with routine healing: Secondary | ICD-10-CM | POA: Diagnosis not present

## 2022-02-23 DIAGNOSIS — S72142D Displaced intertrochanteric fracture of left femur, subsequent encounter for closed fracture with routine healing: Secondary | ICD-10-CM | POA: Diagnosis not present

## 2022-02-27 ENCOUNTER — Ambulatory Visit: Payer: BLUE CROSS/BLUE SHIELD | Admitting: Internal Medicine

## 2022-02-27 DIAGNOSIS — S72142D Displaced intertrochanteric fracture of left femur, subsequent encounter for closed fracture with routine healing: Secondary | ICD-10-CM | POA: Diagnosis not present

## 2022-03-01 DIAGNOSIS — I4891 Unspecified atrial fibrillation: Secondary | ICD-10-CM | POA: Diagnosis not present

## 2022-03-01 DIAGNOSIS — I10 Essential (primary) hypertension: Secondary | ICD-10-CM | POA: Diagnosis not present

## 2022-03-01 DIAGNOSIS — R972 Elevated prostate specific antigen [PSA]: Secondary | ICD-10-CM | POA: Diagnosis not present

## 2022-03-01 DIAGNOSIS — E7800 Pure hypercholesterolemia, unspecified: Secondary | ICD-10-CM | POA: Diagnosis not present

## 2022-03-02 DIAGNOSIS — S72142D Displaced intertrochanteric fracture of left femur, subsequent encounter for closed fracture with routine healing: Secondary | ICD-10-CM | POA: Diagnosis not present

## 2022-03-04 ENCOUNTER — Other Ambulatory Visit: Payer: Self-pay

## 2022-03-05 ENCOUNTER — Other Ambulatory Visit: Payer: Self-pay

## 2022-03-05 MED ORDER — HYDROCODONE-ACETAMINOPHEN 10-325 MG PO TABS
1.0000 | ORAL_TABLET | Freq: Two times a day (BID) | ORAL | 0 refills | Status: DC
  Filled 2022-03-05: qty 60, 30d supply, fill #0

## 2022-03-06 ENCOUNTER — Other Ambulatory Visit: Payer: Self-pay

## 2022-03-06 DIAGNOSIS — S72142D Displaced intertrochanteric fracture of left femur, subsequent encounter for closed fracture with routine healing: Secondary | ICD-10-CM | POA: Diagnosis not present

## 2022-03-07 ENCOUNTER — Other Ambulatory Visit (HOSPITAL_COMMUNITY): Payer: Self-pay | Admitting: Urology

## 2022-03-07 ENCOUNTER — Other Ambulatory Visit: Payer: Self-pay | Admitting: Urology

## 2022-03-07 DIAGNOSIS — K625 Hemorrhage of anus and rectum: Secondary | ICD-10-CM | POA: Diagnosis not present

## 2022-03-07 DIAGNOSIS — R972 Elevated prostate specific antigen [PSA]: Secondary | ICD-10-CM | POA: Diagnosis not present

## 2022-03-07 DIAGNOSIS — N35819 Other urethral stricture, male, unspecified site: Secondary | ICD-10-CM | POA: Diagnosis not present

## 2022-03-07 DIAGNOSIS — R310 Gross hematuria: Secondary | ICD-10-CM | POA: Diagnosis not present

## 2022-03-07 DIAGNOSIS — I480 Paroxysmal atrial fibrillation: Secondary | ICD-10-CM | POA: Diagnosis not present

## 2022-03-07 DIAGNOSIS — N400 Enlarged prostate without lower urinary tract symptoms: Secondary | ICD-10-CM

## 2022-03-08 ENCOUNTER — Encounter: Payer: Self-pay | Admitting: Cardiology

## 2022-03-08 ENCOUNTER — Ambulatory Visit: Payer: BC Managed Care – PPO | Admitting: Cardiology

## 2022-03-08 VITALS — BP 143/82 | HR 85 | Temp 85.0°F | Resp 16 | Ht 73.0 in | Wt 235.0 lb

## 2022-03-08 DIAGNOSIS — Z9889 Other specified postprocedural states: Secondary | ICD-10-CM | POA: Diagnosis not present

## 2022-03-08 DIAGNOSIS — I10 Essential (primary) hypertension: Secondary | ICD-10-CM

## 2022-03-08 DIAGNOSIS — I480 Paroxysmal atrial fibrillation: Secondary | ICD-10-CM | POA: Diagnosis not present

## 2022-03-08 DIAGNOSIS — Q2112 Patent foramen ovale: Secondary | ICD-10-CM

## 2022-03-08 DIAGNOSIS — Z9289 Personal history of other medical treatment: Secondary | ICD-10-CM

## 2022-03-08 DIAGNOSIS — Z8673 Personal history of transient ischemic attack (TIA), and cerebral infarction without residual deficits: Secondary | ICD-10-CM

## 2022-03-08 NOTE — Progress Notes (Signed)
ID:  Chris Hayes, DOB 18-Jun-1960, MRN [id number]  PCP:  Kathalene Frames, MD  Cardiologist:  Rex Kras, DO, [facility] (established care 03/08/2022) Former Cardiology Providers: @ [facility] and [facility].   REASON FOR CONSULT: Atrial Fibrillation   REQUESTING PHYSICIAN:  Kathalene Frames, MD [address], [place],  [place] [address]  Chief Complaint  Patient presents with   Atrial Fibrillation   New Patient (Initial Visit)    HPI  Chris Hayes is a 61 y.o. Caucasian male who presents to the clinic for evaluation of atrial fibrillation at the request of Kathalene Frames, *. His past medical history and cardiovascular risk factors include: History of TIA, hypertension, history of atrial fibrillation status post ablation (2006/2007) and hx of DCCV, PFO, former smoker.   He has had a very long cardiac history predominantly at [facility] in [place] and now has moved to [facility] and here to establish care given his history of atrial fibrillation and to discuss anticoagulation.  Based on the records available in Care Everywhere patient is noted to have atrial fibrillation dating back to 27.  He has undergone multiple cardioversions in the past in 2 ablations and since then has been in normal sinus rhythm.  He has been on anticoagulation in the past as per the records included Coumadin and Pradaxa and currently only on aspirin 81 mg p.o. daily.  He is on metoprolol for rate control strategy.  He is also been on flecainide in the past which has made him feel tired and fatigued.  He was evaluated by [facility] colleagues Dr. Fransico Him who requested EP consultation but patient chose to follow up and reestablish care with our group.  He denies anginal discomfort or heart failure symptoms.  He recently had orthopedic surgery and is recovering from it.  Therefore functional capacity is limited as he ambulates with a cane.   FUNCTIONAL  STATUS: Limited - s/p orthopedic surgery. Currently works w/ cane.    ALLERGIES: No Known Allergies  MEDICATION LIST PRIOR TO VISIT: Current Meds  Medication Sig   Aspirin 81 MG CAPS Take 1 capsule every day by oral route.   HYDROcodone-acetaminophen (NORCO) 10-325 MG tablet 1 tablet as needed Orally twice a day 30 days   losartan (COZAAR) 50 MG tablet Take 1 tablet by mouth daily.   metoprolol succinate (TOPROL-XL) 50 MG 24 hr tablet Take 1 tablet by mouth once daily. (Patient taking differently: Take 50 mg by mouth daily.)     PAST MEDICAL HISTORY: Past Medical History:  Diagnosis Date   Atrial fibrillation ([place])    s/p unsuccessful afib ablation in [place] but successful ablation of what sounds like accessory bypass tract (ablated in ventricle)>>records have been ordered   DDD (degenerative disc disease), lumbar    Elevated PSA    HTN (hypertension)    Other chronic pain    TIA (transient ischemic attack)     PAST SURGICAL HISTORY: Past Surgical History:  Procedure Laterality Date   ATRIAL FIBRILLATION ABLATION     BACK SURGERY     INTRAMEDULLARY (IM) NAIL INTERTROCHANTERIC Left 11/27/2021   Procedure: INTRAMEDULLARY (IM) NAIL INTERTROCHANTRIC;  Surgeon: Paralee Cancel, MD;  Location: [facility] ORS;  Service: Orthopedics;  Laterality: Left;   OPEN ANTERIOR SHOULDER RECONSTRUCTION     URETHRAL RESTRICTION      FAMILY HISTORY: The patient family history includes COPD in his father; Coronary artery disease in his father; Parkinson's disease in his mother.  SOCIAL HISTORY:  The patient  reports that he quit smoking about 29 years ago. His smoking use included cigarettes. He has a 50.00 pack-year smoking history. He has never used smokeless tobacco. He reports current alcohol use. He reports that he does not use drugs.  REVIEW OF SYSTEMS: Review of Systems  Cardiovascular:  Negative for chest pain, claudication, dyspnea on exertion, irregular heartbeat, leg swelling, near-syncope,  orthopnea, palpitations, paroxysmal nocturnal dyspnea and syncope.  Respiratory:  Negative for shortness of breath.   Hematologic/Lymphatic: Negative for bleeding problem.  Musculoskeletal:  Negative for muscle cramps and myalgias.  Neurological:  Negative for dizziness and light-headedness.  All other systems reviewed and are negative.   PHYSICAL EXAM:    03/08/2022    8:41 AM 12/01/2021    8:13 AM 12/01/2021    6:10 AM  Vitals with BMI  Height 6\' 1"     Weight 235 lbs    BMI 31.01    Systolic 143 142  Diastolic 82 87 93  Pulse 85 84 92    Physical Exam  Constitutional: No distress.  Age appropriate, hemodynamically stable.   Neck: No JVD present.  Cardiovascular: Normal rate, regular rhythm, S1 normal, S2 normal, intact distal pulses and normal pulses. Exam reveals no gallop, no S3 and no S4.  No murmur heard. Pulmonary/Chest: Effort normal and breath sounds normal. No stridor. He has no wheezes. He has no rales.  Abdominal: Soft. Bowel sounds are normal. He exhibits no distension. There is no abdominal tenderness.  Musculoskeletal:        General: No edema.     Cervical back: Neck supple.  Neurological: He is alert and oriented to person, place, and time. He has intact cranial nerves (2-12).  Skin: Skin is warm and moist.   CARDIAC DATABASE: EKG: 03/08/2022: Normal sinus rhythm, 89 bpm, without underlying ischemia or injury pattern.  Echocardiogram: 06/2017 Normal left ventricle size, wall thickness and systolic function with ejection fraction 56 %. There are no regional wall motion abnormalities. Diastolic dysfunction noted. Mild left atrial enlargement. All other valves and structures are normal.  Stress Testing: No results found for this or any previous visit from the past 1095 days.   Heart Catheterization: None  LABORATORY DATA:    Latest Ref Rng & Units 11/29/2021    5:49 AM 11/28/2021    4:58 AM 11/27/2021    2:10 PM  CBC  WBC 4.0 - 10.5 K/uL 11.9   9.2    8.8  9.6   Hemoglobin 13.0 - 17.0 g/dL 11/29/2021  40.0    86.7  61.9   Hematocrit 39.0 - 52.0 % 35.4  39.6    40.0  44.5   Platelets 150 - 400 K/uL 144  180    177  189        Latest Ref Rng & Units 11/29/2021    5:49 AM 11/28/2021    4:58 AM 11/27/2021    2:10 PM  CMP  Glucose 70 - 99 mg/dL 11/29/2021  326    712  99   BUN 6 - 20 mg/dL 16  12    12  11    Creatinine 0.61 - 1.24 mg/dL 458     0.99  8.33   Sodium 135 - 145 mmol/L 144  141    141  144   Potassium 3.5 - 5.1 mmol/L 3.8  4.1    4.1  3.6   Chloride 98 - 111 mmol/L 113  109  109  112   CO2 22 - 32 mmol/L 26  24    25  25    Calcium 8.9 - 10.3 mg/dL 8.2  8.8    8.8  9.1   Total Protein 6.5 - 8.1 g/dL 5.4  6.2    Total Bilirubin 0.3 - 1.2 mg/dL 0.9  1.2    Alkaline Phos 38 - 126 U/L 57  64    AST 15 - 41 U/L 22  31    ALT 0 - 44 U/L 20  27      Lipid Panel  No results found for: "CHOL", "TRIG", "HDL", "CHOLHDL", "VLDL", "LDLCALC", "LDLDIRECT", "LABVLDL"  No components found for: "NTPROBNP" No results for input(s): "PROBNP" in the last 8760 hours. No results for input(s): "TSH" in the last 8760 hours.  BMP Recent Labs    11/27/21 1410 11/28/21 0458 11/29/21 0549  NA 144 141  141 144  K 3.6 4.1  4.1 3.8  CL 112* 109  109 113*  CO2 25 24  25 26   GLUCOSE 99 178*  178* 121*  BUN 11 12  12 16   CREATININE 0.84 1.02  1.06 0.83  CALCIUM 9.1 8.8*  8.8* 8.2*  GFRNONAA >60 >60  >60 >60    HEMOGLOBIN A1C No results found for: "HGBA1C", "MPG"  IMPRESSION:    ICD-10-CM   1. Paroxysmal atrial fibrillation (HCC)  I48.0 EKG 12-Lead    ECHOCARDIOGRAM COMPLETE BUBBLE STUDY    2. History of prior ablation treatment  Z98.890     3. History of cardioversion  Z92.89     4. History of TIA (transient ischemic attack)  Z86.73 ECHOCARDIOGRAM COMPLETE BUBBLE STUDY    5. Benign hypertension  I10 ECHOCARDIOGRAM COMPLETE BUBBLE STUDY    6. PFO (patent foramen ovale)  Q21.12 ECHOCARDIOGRAM COMPLETE  BUBBLE STUDY       RECOMMENDATIONS: SHERRI LEVENHAGEN is a 61 y.o. Caucasian male whose past medical history and cardiac risk factors include: History of TIA, hypertension, history of atrial fibrillation status post ablation (2006/2007) and hx of DCCV, PFO, former smoker.  Paroxysmal atrial fibrillation (HCC) / History of prior ablation treatment / History of cardioversion Rate control: Metoprolol succinate. Rhythm control: N/A. Thromboembolic prophylaxis: Aspirin 81 mg p.o. daily. Prior history of cardioversions and atrial fibrillation ablation CHA2DS2-VASc SCORE is 3 which correlates to 3.2% risk of stroke per year (HTN, history of TIA). Based on the records available in Care Everywhere has not done well with Coumadin and frequent INR checks as well as Pradaxa in the past. Given his CHA2DS2-VASc score recommended anticoagulation for thromboembolic prophylaxis; however, patient states that he rather continue with aspirin.  Given his CHA2DS2-VASc score patient is aware that aspirin does not provide adequate reduction for thromboembolic events. Outside records reviewed via Care Everywhere and additional records requested. Echo will be ordered to evaluate for structural heart disease and left ventricular systolic function. EKG today illustrates sinus rhythm  History of TIA (transient ischemic attack) We emphasized the importance of secondary prevention. Currently on aspirin 81 mg p.o. daily. Patient is recommended to have his A1c and lipids checked with PCP and consider statin therapy given his history of TIA  Benign hypertension Within acceptable range. Patient is asked to keep a log of his blood pressures at home and to review with PCP to see if additional medication titration is required. Reemphasized importance of low-salt diet.  PFO (patent foramen ovale) We will proceed with echocardiogram with bubble study. Monitor for now  Data  Reviewed: I have independently reviewed external  notes provided by the referring provider via [facility] and prior cardiology notes, epic and Care Everywhere I have independently reviewed results of EKG & prior echo reports as part of medical decision making. I have ordered the following tests:  Orders Placed This Encounter  Procedures   EKG 12-Lead   ECHOCARDIOGRAM COMPLETE BUBBLE STUDY    Standing Status:   Future    Standing Expiration Date:   03/09/2023    Order Specific Question:   Where should this test be performed    Answer:   [place]    Order Specific Question:   Perflutren DEFINITY (image enhancing agent) should be administered unless hypersensitivity or allergy exist    Answer:   Administer Perflutren    Order Specific Question:   Reason for exam    Answer:   PFO (patent formamen ovale) 745.5 / Q21.1  I have made no medications changes at today's encounter as noted above.  FINAL MEDICATION LIST END OF ENCOUNTER: No orders of the defined types were placed in this encounter.   Medications Discontinued During This Encounter  Medication Reason   HYDROcodone-acetaminophen (NORCO) 7.5-325 MG tablet    HYDROcodone-acetaminophen (NORCO) 7.5-325 MG tablet    HYDROcodone-acetaminophen (NORCO) 10-325 MG tablet    methocarbamol (ROBAXIN) 500 MG tablet      Current Outpatient Medications:    Aspirin 81 MG CAPS, Take 1 capsule every day by oral route., Disp: , Rfl:    HYDROcodone-acetaminophen (NORCO) 10-325 MG tablet, 1 tablet as needed Orally twice a day 30 days, Disp: 60 tablet, Rfl: 0   losartan (COZAAR) 50 MG tablet, Take 1 tablet by mouth daily., Disp: 90 tablet, Rfl: 2   metoprolol succinate (TOPROL-XL) 50 MG 24 hr tablet, Take 1 tablet by mouth once daily. (Patient taking differently: Take 50 mg by mouth daily.), Disp: 30 tablet, Rfl: 5  Orders Placed This Encounter  Procedures   EKG 12-Lead   ECHOCARDIOGRAM COMPLETE BUBBLE STUDY    There are no Patient Instructions on file for this visit.   --Continue  cardiac medications as reconciled in final medication list. --Return in about 6 months (around 09/06/2022) for A. fib. or sooner if needed. --Continue follow-up with your primary care physician regarding the management of your other chronic comorbid conditions.  Patient's questions and concerns were addressed to his satisfaction. He voices understanding of the instructions provided during this encounter.   This note was created using a voice recognition software as a result there may be grammatical errors inadvertently enclosed that do not reflect the nature of this encounter. Every attempt is made to correct such errors.  Tessa Lerner, [place], [facility] [place] [place]  Pager: [phone] Office: [phone]

## 2022-03-13 ENCOUNTER — Ambulatory Visit: Payer: BC Managed Care – PPO | Admitting: Podiatry

## 2022-03-13 ENCOUNTER — Ambulatory Visit (INDEPENDENT_AMBULATORY_CARE_PROVIDER_SITE_OTHER): Payer: BC Managed Care – PPO

## 2022-03-13 ENCOUNTER — Other Ambulatory Visit: Payer: Self-pay

## 2022-03-13 DIAGNOSIS — M216X9 Other acquired deformities of unspecified foot: Secondary | ICD-10-CM

## 2022-03-13 DIAGNOSIS — M722 Plantar fascial fibromatosis: Secondary | ICD-10-CM | POA: Diagnosis not present

## 2022-03-13 MED ORDER — IBUPROFEN 800 MG PO TABS
800.0000 mg | ORAL_TABLET | Freq: Three times a day (TID) | ORAL | 0 refills | Status: AC | PRN
  Filled 2022-03-13 – 2022-04-02 (×2): qty 30, 10d supply, fill #0

## 2022-03-13 MED ORDER — TRIAMCINOLONE ACETONIDE 10 MG/ML IJ SUSP: 10.0000 mg | Freq: Once | INTRAMUSCULAR | Status: AC

## 2022-03-13 NOTE — Progress Notes (Signed)
Subjective:   Patient ID: Chris Hayes, male   DOB: 61 y.o.   MRN: [id number]   HPI Chief Complaint  Patient presents with   Plantar Fasciitis    Patient came in today for plantar fasciitis bilateral, heel and arch pain, started 25 years ago, just start having pain again month ago, Pain rate is a 6 out of 10, Aches, Morning pain and after sitting for a long period of time, TX: Stretching, rolling icing,Injection (seem to help)    61 y.o. male with the above complaints.  No injuries that he reports.  Review of Systems  All other systems reviewed and are negative.  Past Medical History:  Diagnosis Date   Atrial fibrillation [facility])    s/p unsuccessful afib ablation in [place] but successful ablation of what sounds like accessory bypass tract (ablated in ventricle)>>records have been ordered   DDD (degenerative disc disease), lumbar    Elevated PSA    HTN (hypertension)    Other chronic pain    TIA (transient ischemic attack)     Past Surgical History:  Procedure Laterality Date   ATRIAL FIBRILLATION ABLATION     BACK SURGERY     INTRAMEDULLARY (IM) NAIL INTERTROCHANTERIC Left 11/27/2021   Procedure: INTRAMEDULLARY (IM) NAIL INTERTROCHANTRIC;  Surgeon: Durene Romans, MD;  Location: [facility];  Service: Orthopedics;  Laterality: Left;   OPEN ANTERIOR SHOULDER RECONSTRUCTION     URETHRAL RESTRICTION       Current Outpatient Medications:    ibuprofen (ADVIL) 800 MG tablet, Take 1 tablet (800 mg total) by mouth every 8 (eight) hours as needed., Disp: 30 tablet, Rfl: 0   Aspirin 81 MG CAPS, Take 1 capsule every day by oral route., Disp: , Rfl:    HYDROcodone-acetaminophen (NORCO) 10-325 MG tablet, 1 tablet as needed Orally twice a day 30 days, Disp: 60 tablet, Rfl: 0   losartan (COZAAR) 50 MG tablet, Take 1 tablet by mouth daily., Disp: 90 tablet, Rfl: 2   metoprolol succinate (TOPROL-XL) 50 MG 24 hr tablet, Take 1 tablet by mouth once daily. (Patient taking differently: Take 50 mg by  mouth daily.), Disp: 30 tablet, Rfl: 5  Current Facility-Administered Medications:    triamcinolone acetonide (KENALOG) 10 MG/ML injection 10 mg, 10 mg, Other, Once, Angelyne Terwilliger, Lesia Sago, DPM  No Known Allergies         Objective:  Physical Exam  General: AAO x3, NAD  Dermatological: Skin is warm, dry and supple bilateral. There are no open sores, no preulcerative lesions, no rash or signs of infection present.  Vascular: Dorsalis Pedis artery and Posterior Tibial artery pedal pulses are 2/4 bilateral with immedate capillary fill time.  There is no pain with calf compression, swelling, warmth, erythema.   Neruologic: Grossly intact via light touch bilateral.  Negative Tinel sign.  Musculoskeletal:Tenderness to palpation along the plantar medial tubercle of the calcaneus at the insertion of plantar fascia on the left and right foot. There is no pain along the course of the plantar fascia within the arch of the foot. Plantar fascia appears to be intact. There is no pain with lateral compression of the calcaneus or pain with vibratory sensation. There is no pain along the course or insertion of the achilles tendon. No other areas of tenderness to bilateral lower extremities. Muscular strength 5/5 in all groups tested bilateral.  Gait: Unassisted, Nonantalgic.       Assessment:   Bilateral heel pain, Plantar fasciitis     Plan:  -Treatment options  discussed including all alternatives, risks, and complications -Etiology of symptoms were discussed -X-rays were obtained and reviewed with the patient.  3 views bilateral feet were obtained.  No evidence of acute fracture.  Increased calcaneal inclination angle. -Steroid injections performed bilaterally.  See procedure note below. -He would like to proceed with orthotics.  He was measured for inserts today. -Discussed shoe modifications and continue good arch support -Stretching, icing daily  Procedure: Injection  Tendon/Ligament Discussed alternatives, risks, complications and verbal consent was obtained.  Location: Bilateral plantar fascia at the glabrous junction; medial approach. Skin Prep: Alcohol. Injectate: 0.5cc 0.5% marcaine plain, 0.5 cc 2% lidocaine plain and, 1 cc kenalog 10. Disposition: Patient tolerated procedure well. Injection site dressed with a band-aid.  Post-injection care was discussed and return precautions discussed.   Return in about 6 weeks (around 04/24/2022).  Trula Slade DPM

## 2022-03-15 DIAGNOSIS — S72142D Displaced intertrochanteric fracture of left femur, subsequent encounter for closed fracture with routine healing: Secondary | ICD-10-CM | POA: Diagnosis not present

## 2022-03-20 ENCOUNTER — Encounter (HOSPITAL_COMMUNITY): Payer: Self-pay

## 2022-03-20 ENCOUNTER — Ambulatory Visit (HOSPITAL_COMMUNITY): Payer: BC Managed Care – PPO

## 2022-03-20 ENCOUNTER — Other Ambulatory Visit: Payer: Self-pay

## 2022-03-20 DIAGNOSIS — S72142D Displaced intertrochanteric fracture of left femur, subsequent encounter for closed fracture with routine healing: Secondary | ICD-10-CM | POA: Diagnosis not present

## 2022-03-21 DIAGNOSIS — R310 Gross hematuria: Secondary | ICD-10-CM | POA: Diagnosis not present

## 2022-03-22 DIAGNOSIS — S72142D Displaced intertrochanteric fracture of left femur, subsequent encounter for closed fracture with routine healing: Secondary | ICD-10-CM | POA: Diagnosis not present

## 2022-03-27 DIAGNOSIS — S72142D Displaced intertrochanteric fracture of left femur, subsequent encounter for closed fracture with routine healing: Secondary | ICD-10-CM | POA: Diagnosis not present

## 2022-03-28 DIAGNOSIS — S72142A Displaced intertrochanteric fracture of left femur, initial encounter for closed fracture: Secondary | ICD-10-CM | POA: Diagnosis not present

## 2022-04-02 ENCOUNTER — Other Ambulatory Visit: Payer: Self-pay

## 2022-04-03 ENCOUNTER — Other Ambulatory Visit: Payer: Self-pay

## 2022-04-03 MED ORDER — HYDROCODONE-ACETAMINOPHEN 10-325 MG PO TABS
1.0000 | ORAL_TABLET | Freq: Two times a day (BID) | ORAL | 0 refills | Status: DC | PRN
  Filled 2022-04-03: qty 60, 30d supply, fill #0

## 2022-04-04 ENCOUNTER — Ambulatory Visit (INDEPENDENT_AMBULATORY_CARE_PROVIDER_SITE_OTHER): Payer: Self-pay

## 2022-04-04 DIAGNOSIS — M722 Plantar fascial fibromatosis: Secondary | ICD-10-CM

## 2022-04-04 NOTE — Progress Notes (Signed)
Patient presents today to pick up custom molded foot orthotics, diagnosed with plantar fasciitis by Dr. Jacqualyn Posey.   Orthotics were dispensed and fit was satisfactory. Reviewed instructions for break-in and wear. Written instructions given to patient.  Patient will follow up as needed.   [facility] - order # [id number]

## 2022-04-05 DIAGNOSIS — N400 Enlarged prostate without lower urinary tract symptoms: Secondary | ICD-10-CM | POA: Diagnosis not present

## 2022-04-05 DIAGNOSIS — R972 Elevated prostate specific antigen [PSA]: Secondary | ICD-10-CM | POA: Diagnosis not present

## 2022-04-05 DIAGNOSIS — R310 Gross hematuria: Secondary | ICD-10-CM | POA: Diagnosis not present

## 2022-04-05 DIAGNOSIS — N35819 Other urethral stricture, male, unspecified site: Secondary | ICD-10-CM | POA: Diagnosis not present

## 2022-04-06 DIAGNOSIS — S72142D Displaced intertrochanteric fracture of left femur, subsequent encounter for closed fracture with routine healing: Secondary | ICD-10-CM | POA: Diagnosis not present

## 2022-04-10 DIAGNOSIS — S72142D Displaced intertrochanteric fracture of left femur, subsequent encounter for closed fracture with routine healing: Secondary | ICD-10-CM | POA: Diagnosis not present

## 2022-04-16 ENCOUNTER — Encounter (HOSPITAL_COMMUNITY): Payer: Self-pay

## 2022-04-16 ENCOUNTER — Inpatient Hospital Stay (HOSPITAL_COMMUNITY): Admission: RE | Admit: 2022-04-16 | Payer: BC Managed Care – PPO | Source: Ambulatory Visit

## 2022-04-25 ENCOUNTER — Other Ambulatory Visit: Payer: Self-pay

## 2022-04-30 ENCOUNTER — Other Ambulatory Visit: Payer: Self-pay

## 2022-05-01 ENCOUNTER — Other Ambulatory Visit: Payer: Self-pay

## 2022-05-01 MED ORDER — HYDROCODONE-ACETAMINOPHEN 10-325 MG PO TABS
1.0000 | ORAL_TABLET | Freq: Two times a day (BID) | ORAL | 0 refills | Status: DC | PRN
  Filled 2022-05-01: qty 60, 30d supply, fill #0

## 2022-05-30 ENCOUNTER — Other Ambulatory Visit: Payer: Self-pay

## 2022-05-30 ENCOUNTER — Other Ambulatory Visit: Payer: Self-pay | Admitting: Podiatry

## 2022-05-30 MED ORDER — HYDROCODONE-ACETAMINOPHEN 10-325 MG PO TABS
1.0000 | ORAL_TABLET | Freq: Two times a day (BID) | ORAL | 0 refills | Status: DC | PRN
  Filled 2022-05-30: qty 60, 30d supply, fill #0

## 2022-05-31 ENCOUNTER — Other Ambulatory Visit: Payer: Self-pay

## 2022-06-18 DIAGNOSIS — N35011 Post-traumatic bulbous urethral stricture: Secondary | ICD-10-CM | POA: Diagnosis not present

## 2022-06-18 DIAGNOSIS — R310 Gross hematuria: Secondary | ICD-10-CM | POA: Diagnosis not present

## 2022-06-29 ENCOUNTER — Other Ambulatory Visit: Payer: Self-pay

## 2022-07-02 ENCOUNTER — Other Ambulatory Visit: Payer: Self-pay

## 2022-07-02 MED ORDER — HYDROCODONE-ACETAMINOPHEN 10-325 MG PO TABS
1.0000 | ORAL_TABLET | Freq: Two times a day (BID) | ORAL | 0 refills | Status: DC | PRN
  Filled 2022-07-02: qty 60, 30d supply, fill #0

## 2022-07-09 ENCOUNTER — Other Ambulatory Visit: Payer: Self-pay

## 2022-07-13 DIAGNOSIS — M5136 Other intervertebral disc degeneration, lumbar region: Secondary | ICD-10-CM | POA: Diagnosis not present

## 2022-07-13 DIAGNOSIS — I480 Paroxysmal atrial fibrillation: Secondary | ICD-10-CM | POA: Diagnosis not present

## 2022-07-13 DIAGNOSIS — Z8781 Personal history of (healed) traumatic fracture: Secondary | ICD-10-CM | POA: Diagnosis not present

## 2022-07-13 DIAGNOSIS — E7800 Pure hypercholesterolemia, unspecified: Secondary | ICD-10-CM | POA: Diagnosis not present

## 2022-07-13 DIAGNOSIS — Z8673 Personal history of transient ischemic attack (TIA), and cerebral infarction without residual deficits: Secondary | ICD-10-CM | POA: Diagnosis not present

## 2022-07-13 DIAGNOSIS — D6869 Other thrombophilia: Secondary | ICD-10-CM | POA: Diagnosis not present

## 2022-07-13 DIAGNOSIS — I10 Essential (primary) hypertension: Secondary | ICD-10-CM | POA: Diagnosis not present

## 2022-07-20 ENCOUNTER — Other Ambulatory Visit: Payer: Self-pay

## 2022-07-20 MED ORDER — ROSUVASTATIN CALCIUM 10 MG PO TABS
10.0000 mg | ORAL_TABLET | Freq: Every day | ORAL | 3 refills | Status: DC
  Filled 2022-07-20: qty 30, 30d supply, fill #0
  Filled 2022-08-24: qty 30, 30d supply, fill #1
  Filled 2022-10-19: qty 30, 30d supply, fill #2
  Filled 2022-11-18: qty 30, 30d supply, fill #3
  Filled 2022-12-24: qty 30, 30d supply, fill #4
  Filled 2023-01-22: qty 30, 30d supply, fill #5
  Filled 2023-02-22: qty 30, 30d supply, fill #6
  Filled 2023-03-24: qty 30, 30d supply, fill #7
  Filled 2023-06-20: qty 30, 30d supply, fill #8

## 2022-07-25 ENCOUNTER — Other Ambulatory Visit: Payer: Self-pay

## 2022-07-26 ENCOUNTER — Other Ambulatory Visit: Payer: Self-pay

## 2022-07-26 MED ORDER — METOPROLOL SUCCINATE ER 50 MG PO TB24
50.0000 mg | ORAL_TABLET | Freq: Every day | ORAL | 3 refills | Status: DC
  Filled 2022-07-26: qty 30, 30d supply, fill #0
  Filled 2022-08-24 (×2): qty 30, 30d supply, fill #1
  Filled 2022-09-24: qty 30, 30d supply, fill #2
  Filled 2022-10-19: qty 30, 30d supply, fill #3
  Filled 2022-11-18: qty 30, 30d supply, fill #4
  Filled 2022-12-21: qty 30, 30d supply, fill #5
  Filled 2023-01-22: qty 30, 30d supply, fill #6
  Filled 2023-02-22: qty 30, 30d supply, fill #7
  Filled 2023-03-24: qty 30, 30d supply, fill #8
  Filled 2023-06-20: qty 30, 30d supply, fill #9
  Filled 2023-07-24: qty 30, 30d supply, fill #10

## 2022-07-27 ENCOUNTER — Other Ambulatory Visit: Payer: Self-pay | Admitting: Internal Medicine

## 2022-07-27 ENCOUNTER — Other Ambulatory Visit: Payer: Self-pay

## 2022-07-27 DIAGNOSIS — Z8781 Personal history of (healed) traumatic fracture: Secondary | ICD-10-CM

## 2022-08-01 ENCOUNTER — Other Ambulatory Visit: Payer: Self-pay

## 2022-08-02 ENCOUNTER — Other Ambulatory Visit: Payer: Self-pay

## 2022-08-02 MED ORDER — HYDROCODONE-ACETAMINOPHEN 10-325 MG PO TABS
ORAL_TABLET | ORAL | 0 refills | Status: DC
  Filled 2022-08-02: qty 60, 30d supply, fill #0

## 2022-08-23 ENCOUNTER — Other Ambulatory Visit: Payer: Self-pay

## 2022-08-23 DIAGNOSIS — L814 Other melanin hyperpigmentation: Secondary | ICD-10-CM | POA: Diagnosis not present

## 2022-08-23 DIAGNOSIS — D225 Melanocytic nevi of trunk: Secondary | ICD-10-CM | POA: Diagnosis not present

## 2022-08-23 DIAGNOSIS — L218 Other seborrheic dermatitis: Secondary | ICD-10-CM | POA: Diagnosis not present

## 2022-08-23 DIAGNOSIS — L821 Other seborrheic keratosis: Secondary | ICD-10-CM | POA: Diagnosis not present

## 2022-08-23 MED ORDER — KETOCONAZOLE 2 % EX CREA
1.0000 | TOPICAL_CREAM | Freq: Every day | CUTANEOUS | 1 refills | Status: AC
  Filled 2022-08-23: qty 30, 30d supply, fill #0
  Filled 2022-10-26: qty 60, 30d supply, fill #1
  Filled 2022-11-27: qty 30, 15d supply, fill #2

## 2022-08-23 MED ORDER — KETOCONAZOLE 2 % EX SHAM
1.0000 | MEDICATED_SHAMPOO | CUTANEOUS | 3 refills | Status: AC
  Filled 2022-08-23: qty 120, 30d supply, fill #0
  Filled 2022-10-26: qty 120, 30d supply, fill #1
  Filled 2022-11-27 (×2): qty 120, 30d supply, fill #2
  Filled 2023-01-22: qty 120, 30d supply, fill #3

## 2022-08-24 ENCOUNTER — Other Ambulatory Visit: Payer: Self-pay

## 2022-08-29 ENCOUNTER — Other Ambulatory Visit: Payer: Self-pay

## 2022-08-30 ENCOUNTER — Other Ambulatory Visit: Payer: Self-pay

## 2022-08-30 MED ORDER — HYDROCODONE-ACETAMINOPHEN 10-325 MG PO TABS
1.0000 | ORAL_TABLET | Freq: Two times a day (BID) | ORAL | 0 refills | Status: DC | PRN
  Filled 2022-08-30: qty 60, 30d supply, fill #0

## 2022-09-08 ENCOUNTER — Other Ambulatory Visit (HOSPITAL_COMMUNITY): Payer: Self-pay

## 2022-09-14 DIAGNOSIS — E7800 Pure hypercholesterolemia, unspecified: Secondary | ICD-10-CM | POA: Diagnosis not present

## 2022-09-17 ENCOUNTER — Other Ambulatory Visit: Payer: Self-pay

## 2022-09-17 MED ORDER — ROSUVASTATIN CALCIUM 10 MG PO TABS
10.0000 mg | ORAL_TABLET | Freq: Every day | ORAL | 3 refills | Status: DC
  Filled 2022-09-17 – 2022-09-24 (×2): qty 30, 30d supply, fill #0

## 2022-09-21 ENCOUNTER — Other Ambulatory Visit: Payer: Self-pay

## 2022-09-24 ENCOUNTER — Other Ambulatory Visit: Payer: Self-pay

## 2022-09-27 ENCOUNTER — Other Ambulatory Visit: Payer: Self-pay

## 2022-09-27 MED ORDER — HYDROCODONE-ACETAMINOPHEN 10-325 MG PO TABS
1.0000 | ORAL_TABLET | Freq: Two times a day (BID) | ORAL | 0 refills | Status: DC
  Filled 2022-09-27: qty 60, 30d supply, fill #0

## 2022-10-10 ENCOUNTER — Other Ambulatory Visit: Payer: Self-pay

## 2022-10-19 ENCOUNTER — Other Ambulatory Visit: Payer: Self-pay

## 2022-10-22 ENCOUNTER — Other Ambulatory Visit: Payer: Self-pay

## 2022-10-22 MED ORDER — LOSARTAN POTASSIUM 50 MG PO TABS
50.0000 mg | ORAL_TABLET | Freq: Every day | ORAL | 3 refills | Status: AC
  Filled 2022-10-22: qty 30, 30d supply, fill #0
  Filled 2022-11-18: qty 30, 30d supply, fill #1
  Filled 2022-12-21: qty 30, 30d supply, fill #2
  Filled 2023-01-22 – 2023-04-17 (×5): qty 30, 30d supply, fill #3
  Filled 2023-06-20: qty 30, 30d supply, fill #4
  Filled 2023-07-24: qty 30, 30d supply, fill #5

## 2022-10-25 ENCOUNTER — Other Ambulatory Visit: Payer: Self-pay

## 2022-10-26 ENCOUNTER — Other Ambulatory Visit: Payer: Self-pay

## 2022-10-29 ENCOUNTER — Other Ambulatory Visit: Payer: Self-pay

## 2022-10-29 MED ORDER — HYDROCODONE-ACETAMINOPHEN 10-325 MG PO TABS
1.0000 | ORAL_TABLET | Freq: Two times a day (BID) | ORAL | 0 refills | Status: DC
  Filled 2022-10-29: qty 60, 30d supply, fill #0

## 2022-11-20 ENCOUNTER — Other Ambulatory Visit: Payer: Self-pay

## 2022-11-27 ENCOUNTER — Other Ambulatory Visit: Payer: Self-pay

## 2022-11-27 DIAGNOSIS — L298 Other pruritus: Secondary | ICD-10-CM | POA: Diagnosis not present

## 2022-11-27 DIAGNOSIS — D225 Melanocytic nevi of trunk: Secondary | ICD-10-CM | POA: Diagnosis not present

## 2022-11-27 DIAGNOSIS — L538 Other specified erythematous conditions: Secondary | ICD-10-CM | POA: Diagnosis not present

## 2022-11-27 DIAGNOSIS — L218 Other seborrheic dermatitis: Secondary | ICD-10-CM | POA: Diagnosis not present

## 2022-11-27 DIAGNOSIS — L821 Other seborrheic keratosis: Secondary | ICD-10-CM | POA: Diagnosis not present

## 2022-11-27 DIAGNOSIS — L820 Inflamed seborrheic keratosis: Secondary | ICD-10-CM | POA: Diagnosis not present

## 2022-11-27 DIAGNOSIS — L578 Other skin changes due to chronic exposure to nonionizing radiation: Secondary | ICD-10-CM | POA: Diagnosis not present

## 2022-11-27 MED ORDER — HYDROCODONE-ACETAMINOPHEN 10-325 MG PO TABS
1.0000 | ORAL_TABLET | Freq: Two times a day (BID) | ORAL | 0 refills | Status: DC
  Filled 2022-11-27: qty 60, 30d supply, fill #0

## 2022-11-28 ENCOUNTER — Other Ambulatory Visit: Payer: Self-pay

## 2022-12-21 ENCOUNTER — Other Ambulatory Visit: Payer: Self-pay

## 2022-12-25 ENCOUNTER — Other Ambulatory Visit: Payer: Self-pay

## 2022-12-25 ENCOUNTER — Encounter: Payer: Self-pay | Admitting: Podiatry

## 2022-12-25 MED ORDER — HYDROCODONE-ACETAMINOPHEN 10-325 MG PO TABS
1.0000 | ORAL_TABLET | Freq: Two times a day (BID) | ORAL | 0 refills | Status: AC
  Filled 2022-12-25: qty 60, 30d supply, fill #0

## 2022-12-27 ENCOUNTER — Other Ambulatory Visit: Payer: Self-pay

## 2022-12-27 ENCOUNTER — Ambulatory Visit
Admission: RE | Admit: 2022-12-27 | Discharge: 2022-12-27 | Disposition: A | Discharge status: Home / Self Care | Payer: BC Managed Care – PPO | Source: Ambulatory Visit | Attending: Internal Medicine | Admitting: Internal Medicine

## 2022-12-27 DIAGNOSIS — N958 Other specified menopausal and perimenopausal disorders: Secondary | ICD-10-CM | POA: Diagnosis not present

## 2022-12-27 DIAGNOSIS — M8588 Other specified disorders of bone density and structure, other site: Secondary | ICD-10-CM | POA: Diagnosis not present

## 2022-12-27 DIAGNOSIS — Z8781 Personal history of (healed) traumatic fracture: Secondary | ICD-10-CM

## 2023-01-15 DIAGNOSIS — M5136 Other intervertebral disc degeneration, lumbar region: Secondary | ICD-10-CM | POA: Diagnosis not present

## 2023-01-15 DIAGNOSIS — I10 Essential (primary) hypertension: Secondary | ICD-10-CM | POA: Diagnosis not present

## 2023-01-15 DIAGNOSIS — M810 Age-related osteoporosis without current pathological fracture: Secondary | ICD-10-CM | POA: Diagnosis not present

## 2023-01-15 DIAGNOSIS — E7800 Pure hypercholesterolemia, unspecified: Secondary | ICD-10-CM | POA: Diagnosis not present

## 2023-01-15 DIAGNOSIS — Z0000 Encounter for general adult medical examination without abnormal findings: Secondary | ICD-10-CM | POA: Diagnosis not present

## 2023-01-18 DIAGNOSIS — M810 Age-related osteoporosis without current pathological fracture: Secondary | ICD-10-CM | POA: Diagnosis not present

## 2023-01-18 DIAGNOSIS — Z79899 Other long term (current) drug therapy: Secondary | ICD-10-CM | POA: Diagnosis not present

## 2023-01-18 DIAGNOSIS — E7800 Pure hypercholesterolemia, unspecified: Secondary | ICD-10-CM | POA: Diagnosis not present

## 2023-01-18 DIAGNOSIS — I10 Essential (primary) hypertension: Secondary | ICD-10-CM | POA: Diagnosis not present

## 2023-01-21 DIAGNOSIS — M65331 Trigger finger, right middle finger: Secondary | ICD-10-CM | POA: Diagnosis not present

## 2023-01-22 ENCOUNTER — Other Ambulatory Visit: Payer: Self-pay

## 2023-01-23 ENCOUNTER — Other Ambulatory Visit: Payer: Self-pay

## 2023-01-23 ENCOUNTER — Other Ambulatory Visit (HOSPITAL_COMMUNITY): Payer: Self-pay

## 2023-01-23 MED ORDER — HYDROCODONE-ACETAMINOPHEN 10-325 MG PO TABS
1.0000 | ORAL_TABLET | Freq: Three times a day (TID) | ORAL | 0 refills | Status: DC
  Filled 2023-01-23: qty 90, 30d supply, fill #0

## 2023-01-23 MED ORDER — LOSARTAN POTASSIUM 50 MG PO TABS
100.0000 mg | ORAL_TABLET | Freq: Every day | ORAL | 3 refills | Status: DC
  Filled 2023-01-23: qty 60, 30d supply, fill #0
  Filled 2023-02-22: qty 60, 30d supply, fill #1
  Filled 2023-03-24: qty 60, 30d supply, fill #2
  Filled 2023-05-23: qty 60, 30d supply, fill #3

## 2023-01-24 ENCOUNTER — Other Ambulatory Visit: Payer: Self-pay

## 2023-02-05 DIAGNOSIS — E559 Vitamin D deficiency, unspecified: Secondary | ICD-10-CM | POA: Diagnosis not present

## 2023-02-05 DIAGNOSIS — I10 Essential (primary) hypertension: Secondary | ICD-10-CM | POA: Diagnosis not present

## 2023-02-05 DIAGNOSIS — M810 Age-related osteoporosis without current pathological fracture: Secondary | ICD-10-CM | POA: Diagnosis not present

## 2023-02-22 ENCOUNTER — Other Ambulatory Visit: Payer: Self-pay

## 2023-02-25 ENCOUNTER — Other Ambulatory Visit: Payer: Self-pay

## 2023-02-25 MED ORDER — HYDROCODONE-ACETAMINOPHEN 10-325 MG PO TABS
1.0000 | ORAL_TABLET | Freq: Three times a day (TID) | ORAL | 0 refills | Status: DC | PRN
  Filled 2023-02-25: qty 90, 30d supply, fill #0

## 2023-03-04 DIAGNOSIS — M65331 Trigger finger, right middle finger: Secondary | ICD-10-CM | POA: Diagnosis not present

## 2023-03-24 ENCOUNTER — Other Ambulatory Visit: Payer: Self-pay

## 2023-03-25 ENCOUNTER — Other Ambulatory Visit: Payer: Self-pay

## 2023-03-25 MED ORDER — HYDROCODONE-ACETAMINOPHEN 10-325 MG PO TABS
1.0000 | ORAL_TABLET | Freq: Three times a day (TID) | ORAL | 0 refills | Status: DC | PRN
  Filled 2023-03-25: qty 90, 30d supply, fill #0

## 2023-03-26 ENCOUNTER — Other Ambulatory Visit: Payer: Self-pay

## 2023-04-16 ENCOUNTER — Other Ambulatory Visit: Payer: Self-pay

## 2023-04-16 MED ORDER — METOPROLOL SUCCINATE ER 50 MG PO TB24
50.0000 mg | ORAL_TABLET | Freq: Every day | ORAL | 3 refills | Status: DC
  Filled 2023-04-17: qty 30, 30d supply, fill #0
  Filled 2023-05-23: qty 30, 30d supply, fill #1

## 2023-04-16 MED ORDER — ROSUVASTATIN CALCIUM 10 MG PO TABS
10.0000 mg | ORAL_TABLET | Freq: Every day | ORAL | 3 refills | Status: DC
  Filled 2023-04-17: qty 30, 30d supply, fill #0
  Filled 2023-05-23: qty 30, 30d supply, fill #1

## 2023-04-16 MED ORDER — HYDROCODONE-ACETAMINOPHEN 10-325 MG PO TABS
1.0000 | ORAL_TABLET | Freq: Two times a day (BID) | ORAL | 0 refills | Status: AC
Start: 2023-04-16 — End: ?
  Filled 2023-04-25: qty 60, 30d supply, fill #0

## 2023-04-17 ENCOUNTER — Other Ambulatory Visit: Payer: Self-pay

## 2023-04-25 ENCOUNTER — Other Ambulatory Visit: Payer: Self-pay

## 2023-05-04 LAB — COLOGUARD: COLOGUARD: POSITIVE — AB

## 2023-05-15 ENCOUNTER — Encounter: Payer: Self-pay | Admitting: Internal Medicine

## 2023-05-23 ENCOUNTER — Other Ambulatory Visit: Payer: Self-pay

## 2023-05-23 MED ORDER — HYDROCODONE-ACETAMINOPHEN 10-325 MG PO TABS
ORAL_TABLET | ORAL | 0 refills | Status: DC
  Filled 2023-05-23: qty 60, 30d supply, fill #0

## 2023-05-24 ENCOUNTER — Other Ambulatory Visit: Payer: Self-pay

## 2023-05-31 ENCOUNTER — Ambulatory Visit (AMBULATORY_SURGERY_CENTER): Payer: Self-pay

## 2023-05-31 VITALS — Ht 73.0 in | Wt 250.0 lb

## 2023-05-31 DIAGNOSIS — R195 Other fecal abnormalities: Secondary | ICD-10-CM

## 2023-05-31 NOTE — Progress Notes (Signed)
No egg or soy allergy known to patient  No issues known to pt with past sedation with any surgeries or procedures Patient denies ever being told they had issues or difficulty with intubation  No FH of Malignant Hyperthermia Pt is not on diet pills Pt is not on  home 02  Pt is not on blood thinners  Pt denies issues with constipation  No A fib or A flutter Have any cardiac testing pending--no  LOA: independent  Prep: spilt ose miralax   Patient's chart reviewed by Cathlyn Parsons CNRA prior to previsit and patient appropriate for the [facility].  Previsit completed and red dot placed by patient's name on their procedure day (on provider's schedule).     PV competed with patient. Prep instructions sent via mychart and home address.

## 2023-06-04 ENCOUNTER — Other Ambulatory Visit: Payer: Self-pay

## 2023-06-13 ENCOUNTER — Telehealth: Payer: Self-pay

## 2023-06-13 NOTE — Telephone Encounter (Signed)
 Patient is returned call and stated that he needs someone to call him back so he can go over correctly how his prep medication should be taken. Patient is requesting a call back. Please advise.

## 2023-06-13 NOTE — Telephone Encounter (Signed)
 Inbound call from patient stating he is able to come in tomorrow at 10:00 am for colonoscopy. Requesting a call to confirm prep instructions. Please advise, thank you.

## 2023-06-14 ENCOUNTER — Ambulatory Visit (AMBULATORY_SURGERY_CENTER): Payer: BC Managed Care – PPO | Admitting: Internal Medicine

## 2023-06-14 ENCOUNTER — Encounter: Payer: Self-pay | Admitting: Internal Medicine

## 2023-06-14 VITALS — BP 125/78 | HR 97 | Temp 98.0°F | Resp 30 | Ht 73.0 in | Wt 250.0 lb

## 2023-06-14 DIAGNOSIS — D122 Benign neoplasm of ascending colon: Secondary | ICD-10-CM

## 2023-06-14 DIAGNOSIS — Z1211 Encounter for screening for malignant neoplasm of colon: Secondary | ICD-10-CM

## 2023-06-14 DIAGNOSIS — R195 Other fecal abnormalities: Secondary | ICD-10-CM

## 2023-06-14 DIAGNOSIS — K5730 Diverticulosis of large intestine without perforation or abscess without bleeding: Secondary | ICD-10-CM | POA: Diagnosis not present

## 2023-06-14 DIAGNOSIS — K648 Other hemorrhoids: Secondary | ICD-10-CM

## 2023-06-14 DIAGNOSIS — Z860101 Personal history of adenomatous and serrated colon polyps: Secondary | ICD-10-CM | POA: Insufficient documentation

## 2023-06-14 MED ORDER — SODIUM CHLORIDE 0.9 % IV SOLN: 500.0000 mL | Freq: Once | INTRAVENOUS | Status: DC

## 2023-06-14 NOTE — Telephone Encounter (Signed)
 Returned call to patient, no answer.Left VM to call us back if he still has any questions and reminded patient  of 2 pm arrival for 3 pm procedure.

## 2023-06-14 NOTE — Progress Notes (Signed)
 Called to room to assist during endoscopic procedure.  Patient ID and intended procedure confirmed with present staff. Received instructions for my participation in the procedure from the performing physician.

## 2023-06-14 NOTE — Progress Notes (Signed)
 [facility] History and Physical   Primary Care Physician:  Jhon Elveria LABOR, MD   Reason for Procedure:    Encounter Diagnosis  Name Primary?   Positive colorectal cancer screening using Cologuard test Yes     Plan:    colonoscopy     HPI: Chris Hayes is a 63 y.o. male here for evaluation of abnormal Cologuard result   Past Medical History:  Diagnosis Date   Atrial fibrillation (HCC)    s/[place] in [place] but successful ablation of what sounds like accessory bypass tract (ablated in ventricle)>>records have been ordered   DDD (degenerative disc disease), lumbar    Elevated PSA    HTN (hypertension)    Other chronic pain    TIA (transient ischemic attack)     Past Surgical History:  Procedure Laterality Date   ATRIAL FIBRILLATION ABLATION     BACK SURGERY     INTRAMEDULLARY (IM) NAIL INTERTROCHANTERIC Left 11/27/2021   Procedure: INTRAMEDULLARY (IM) NAIL INTERTROCHANTRIC;  Surgeon: Ernie Cough, MD;  Location: [facility];  Service: Orthopedics;  Laterality: Left;   OPEN ANTERIOR SHOULDER RECONSTRUCTION     URETHRAL RESTRICTION      Prior to Admission medications   Medication Sig Start Date End Date Taking? Authorizing Provider  Aspirin  81 MG CAPS Take 1 capsule every day by oral route.   Yes [provider]  HYDROcodone -acetaminophen  (NORCO) 10-325 MG tablet Take 1 tablet by mouth 2 (two) times daily. 04/16/23  Yes   losartan  (COZAAR ) 50 MG tablet Take 1 tablet (50 mg total) by mouth daily. 10/19/22  Yes   metoprolol  succinate (TOPROL -XL) 50 MG 24 hr tablet Take 1 tablet (50 mg total) by mouth daily. 07/26/22  Yes   rosuvastatin  (CRESTOR ) 10 MG tablet Take 1 tablet (10 mg total) by mouth daily. 07/20/22  Yes   ibuprofen  (ADVIL ) 800 MG tablet Take 1 tablet (800 mg total) by mouth every 8 (eight) hours as needed. Patient not taking: Reported on 06/14/2023 03/13/22   Gershon Cough SAUNDERS, DPM  ketoconazole  (NIZORAL ) 2 % cream Apply to  the face once daily Patient not taking: Reported on 06/14/2023 08/23/22     ketoconazole  (NIZORAL ) 2 % shampoo Apply and use 1 (one) to 3 (three) times a week. Patient not taking: Reported on 06/14/2023 08/24/22       Current Outpatient Medications  Medication Sig Dispense Refill   Aspirin  81 MG CAPS Take 1 capsule every day by oral route.     HYDROcodone -acetaminophen  (NORCO) 10-325 MG tablet Take 1 tablet by mouth 2 (two) times daily. 60 tablet 0   losartan  (COZAAR ) 50 MG tablet Take 1 tablet (50 mg total) by mouth daily. 90 tablet 3   metoprolol  succinate (TOPROL -XL) 50 MG 24 hr tablet Take 1 tablet (50 mg total) by mouth daily. 90 tablet 3   rosuvastatin  (CRESTOR ) 10 MG tablet Take 1 tablet (10 mg total) by mouth daily. 90 tablet 3   ibuprofen  (ADVIL ) 800 MG tablet Take 1 tablet (800 mg total) by mouth every 8 (eight) hours as needed. (Patient not taking: Reported on 06/14/2023) 30 tablet 0   ketoconazole  (NIZORAL ) 2 % cream Apply to the face once daily (Patient not taking: Reported on 06/14/2023) 60 g 1   ketoconazole  (NIZORAL ) 2 % shampoo Apply and use 1 (one) to 3 (three) times a week. (Patient not taking: Reported on 06/14/2023) 120 mL 3   Current Facility-Administered Medications  Medication Dose Route Frequency Provider Last Rate  Last Admin   0.9 %  sodium chloride  infusion  500 mL Intravenous Once Avram Lupita BRAVO, MD       triamcinolone  acetonide (KENALOG ) 10 MG/ML injection 10 mg  10 mg Other Once Gershon Donnice SAUNDERS, DPM        Allergies as of 06/14/2023 - Review Complete 06/14/2023  Allergen Reaction Noted   Amoxicillin Hives 06/13/2017    Family History  Problem Relation Age of Onset   Parkinson's disease Mother    Coronary artery disease Father    COPD Father    Colon cancer Maternal Grandmother    Colon polyps Neg Hx    Esophageal cancer Neg Hx    Rectal cancer Neg Hx    Stomach cancer Neg Hx     Social History   Socioeconomic History   Marital status: Married     Spouse name: Not on file   Number of children: 2   Years of education: Not on file   Highest education level: Not on file  Occupational History   Not on file  Tobacco Use   Smoking status: Former    Current packs/day: 0.00    Average packs/day: 2.0 packs/day for 25.0 years (50.0 ttl pk-yrs)    Types: Cigarettes    Start date: 11/04/1967    Quit date: 11/03/1992    Years since quitting: 30.6   Smokeless tobacco: Never  Vaping Use   Vaping status: Never Used  Substance and Sexual Activity   Alcohol use: Yes    Comment: occ   Drug use: Never   Sexual activity: Not on file  Other Topics Concern   Not on file  Social History Narrative   Not on file   Social Drivers of Health   Financial Resource Strain: Not on file  Food Insecurity: Low Risk  (04/16/2023)   Received from [facility]   Hunger Vital Sign    Worried About Running Out of Food in the Last Year: Never true    Ran Out of Food in the Last Year: Never true  Transportation Needs: No Transportation Needs (04/16/2023)   Received from [facility]    In the past 12 months, has lack of reliable transportation kept you from medical appointments, meetings, work or from getting things needed for daily living? : No  Physical Activity: Not on file  Stress: Not on file  Social Connections: Not on file  Intimate Partner Violence: Not on file    Review of Systems:  All other review of systems negative except as mentioned in the HPI.  Physical Exam: Vital signs BP (!) 135/102   Pulse 97   Temp 98 F (36.7 C) (Temporal)   Ht 6' 1 (1.854 m)   Wt 250 lb (113.4 kg)   SpO2 96%   BMI 32.98 kg/m   General:   Alert,  Well-developed, well-nourished, pleasant and cooperative in NAD Lungs:  Clear throughout to auscultation.   Heart:  Regular rate and rhythm; no murmurs, clicks, rubs,  or gallops. Abdomen:  Soft, nontender and nondistended. Normal bowel sounds.   Neuro/Psych:  Alert and cooperative. Normal  mood and affect. A and O x 3   @Dorsey [employer]hement  CHARLENA Avram, MD, [facility] [phone] (pager) 06/14/2023 10:40 AM@

## 2023-06-14 NOTE — Progress Notes (Signed)
 Sedate, gd SR, tolerated procedure well, VSS, report to RN

## 2023-06-14 NOTE — Op Note (Addendum)
 [facility] Patient Name: Chris Hayes Procedure Date: 06/14/2023 11:14 AM MRN: [id number] Endoscopist: Lupita FORBES Commander , MD, [id number] Age: 63 Referring MD:  Date of Birth: 28-Oct-1960 Gender: Male Account #: [id number] Procedure:                Colonoscopy Indications:              Positive Cologuard test Medicines:                Monitored Anesthesia Care Procedure:                Pre-Anesthesia Assessment:                           - Prior to the procedure, a History and Physical                            was performed, and patient medications and                            allergies were reviewed. The patient's tolerance of                            previous anesthesia was also reviewed. The risks                            and benefits of the procedure and the sedation                            options and risks were discussed with the patient.                            All questions were answered, and informed consent                            was obtained. Prior Anticoagulants: The patient has                            taken no anticoagulant or antiplatelet agents. ASA                            Grade Assessment: II - A patient with mild systemic                            disease. After reviewing the risks and benefits,                            the patient was deemed in satisfactory condition to                            undergo the procedure.                           After obtaining informed consent, the colonoscope  was passed under direct vision. Throughout the                            procedure, the patient's blood pressure, pulse, and                            oxygen saturations were monitored continuously. The                            Olympus Scope SN: [id number] was introduced through                            the anus and advanced to the the cecum, identified                            by appendiceal orifice and  ileocecal valve. The                            colonoscopy was performed without difficulty. The                            patient tolerated the procedure well. The quality                            of the bowel preparation was good. The ileocecal                            valve, appendiceal orifice, and rectum were                            photographed. The bowel preparation used was                            Miralax  via split dose instruction. Findings:                 The perianal and digital rectal examinations were                            normal.                           A 5 mm polyp was found in the ascending colon. The                            polyp was sessile. The polyp was removed with a                            cold snare. Resection and retrieval were complete.                            Verification of patient identification for the                            specimen was  done. Estimated blood loss was minimal.                           Multiple diverticula were found in the sigmoid                            colon.                           Internal hemorrhoids were found.                           A scar was found in the rectum.                           The exam was otherwise without abnormality on                            direct and retroflexion views. Complications:            No immediate complications. Estimated Blood Loss:     Estimated blood loss was minimal. Impression:               - One 5 mm polyp in the ascending colon, removed                            with a cold snare. Resected and retrieved.                           - Diverticulosis in the sigmoid colon.                           - Internal hemorrhoids.                           - Scar in the rectum. Looks like a prior hemorrhoid                            banding.                           - The examination was otherwise normal on direct                            and retroflexion  views. Recommendation:           - Patient has a contact number available for                            emergencies. The signs and symptoms of potential                            delayed complications were discussed with the                            patient. Return to normal activities tomorrow.  Written discharge instructions were provided to the                            patient.                           - Resume previous diet.                           - Continue present medications.                           - Await pathology results.                           - Repeat colonoscopy is recommended. The                            colonoscopy date will be determined after pathology                            results from today's exam become available for                            review. Lupita FORBES Commander, MD 06/14/2023 11:18:10 AM This report has been signed electronically.

## 2023-06-14 NOTE — Patient Instructions (Addendum)
 I found and removed one small polyp.  I will let you know pathology results and when to have another routine colonoscopy by mail and/or My Chart.  Also saw diverticulosis and some hemorrhoids. Looks like you had hemorrhoid banding in the past.  I appreciate the opportunity to care for you. Lupita CHARLENA Commander, MD, FACG  YOU HAD AN ENDOSCOPIC PROCEDURE TODAY AT THE [facility]:   Refer to the procedure report that was given to you for any specific questions about what was found during the examination.  If the procedure report does not answer your questions, please call your gastroenterologist to clarify.  If you requested that your care partner not be given the details of your procedure findings, then the procedure report has been included in a sealed envelope for you to review at your convenience later.  YOU SHOULD EXPECT: Some feelings of bloating in the abdomen. Passage of more gas than usual.  Walking can help get rid of the air that was put into your GI tract during the procedure and reduce the bloating. If you had a lower endoscopy (such as a colonoscopy or flexible sigmoidoscopy) you may notice spotting of blood in your stool or on the toilet paper. If you underwent a bowel prep for your procedure, you may not have a normal bowel movement for a few days.  Please Note:  You might notice some irritation and congestion in your nose or some drainage.  This is from the oxygen used during your procedure.  There is no need for concern and it should clear up in a day or so.  SYMPTOMS TO REPORT IMMEDIATELY:  Following lower endoscopy (colonoscopy or flexible sigmoidoscopy):  Excessive amounts of blood in the stool  Significant tenderness or worsening of abdominal pains  Swelling of the abdomen that is new, acute  Fever of 100F or higher  For urgent or emergent issues, a gastroenterologist can be reached at any hour by calling [phone]. Do not use MyChart messaging for urgent  concerns.    DIET:  We do recommend a small meal at first, but then you may proceed to your regular diet.  Drink plenty of fluids but you should avoid alcoholic beverages for 24 hours.  ACTIVITY:  You should plan to take it easy for the rest of today and you should NOT DRIVE or use heavy machinery until tomorrow (because of the sedation medicines used during the test).    FOLLOW UP: Our staff will call the number listed on your records the next business day following your procedure.  We will call around 7:15- 8:00 am to check on you and address any questions or concerns that you may have regarding the information given to you following your procedure. If we do not reach you, we will leave a message.     If any biopsies were taken you will be contacted by phone or by letter within the next 1-3 weeks.  Please call us  at [phone] if you have not heard about the biopsies in 3 weeks.    SIGNATURES/CONFIDENTIALITY: You and/or your care partner have signed paperwork which will be entered into your electronic medical record.  These signatures attest to the fact that that the information above on your After Visit Summary has been reviewed and is understood.  Full responsibility of the confidentiality of this discharge information lies with you and/or your care-partner.

## 2023-06-14 NOTE — Progress Notes (Signed)
 Pt's states no medical or surgical changes since previsit or office visit.   Pt stated he has a history of atrial fibrillation.  Medical history reviewed and atrial fibrillation noted.

## 2023-06-17 ENCOUNTER — Telehealth: Payer: Self-pay | Admitting: *Deleted

## 2023-06-17 NOTE — Telephone Encounter (Signed)
  Follow up Call-     06/14/2023    9:30 AM  Call back number  Post procedure Call Back phone  # [phone]  Permission to leave phone message Yes     Patient questions:  Do you have a fever, pain , or abdominal swelling? No. Pain Score  0 *  Have you tolerated food without any problems? Yes.    Have you been able to return to your normal activities? Yes.    Do you have any questions about your discharge instructions: Diet   No. Medications  No. Follow up visit  No.  Do you have questions or concerns about your Care? No.  Actions: * If pain score is 4 or above: No action needed, pain <4.

## 2023-06-18 LAB — SURGICAL PATHOLOGY

## 2023-06-19 ENCOUNTER — Encounter: Payer: Self-pay | Admitting: Internal Medicine

## 2023-06-19 DIAGNOSIS — Z860101 Personal history of adenomatous and serrated colon polyps: Secondary | ICD-10-CM

## 2023-06-25 ENCOUNTER — Other Ambulatory Visit: Payer: Self-pay

## 2023-06-26 ENCOUNTER — Other Ambulatory Visit: Payer: Self-pay

## 2023-06-27 ENCOUNTER — Other Ambulatory Visit: Payer: Self-pay

## 2023-06-27 MED ORDER — HYDROCODONE-ACETAMINOPHEN 10-325 MG PO TABS
1.0000 | ORAL_TABLET | Freq: Two times a day (BID) | ORAL | 0 refills | Status: AC | PRN
  Filled 2023-06-27: qty 60, 30d supply, fill #0

## 2023-07-01 ENCOUNTER — Other Ambulatory Visit: Payer: Self-pay

## 2023-07-24 ENCOUNTER — Other Ambulatory Visit: Payer: Self-pay

## 2023-07-25 ENCOUNTER — Other Ambulatory Visit: Payer: Self-pay

## 2023-07-25 MED ORDER — ROSUVASTATIN CALCIUM 10 MG PO TABS
10.0000 mg | ORAL_TABLET | Freq: Every day | ORAL | 3 refills | Status: AC
  Filled 2023-07-25: qty 90, 90d supply, fill #0
  Filled 2023-09-26: qty 90, 90d supply, fill #1
  Filled 2023-10-28: qty 90, 90d supply, fill #2
  Filled 2023-11-05 – 2023-11-29 (×2): qty 90, 90d supply, fill #0
  Filled ????-??-??: fill #2

## 2023-07-25 MED ORDER — HYDROCODONE-ACETAMINOPHEN 10-325 MG PO TABS
1.0000 | ORAL_TABLET | Freq: Two times a day (BID) | ORAL | 0 refills | Status: DC
  Filled 2023-07-29 – 2023-07-30 (×2): qty 60, 30d supply, fill #0

## 2023-07-29 ENCOUNTER — Other Ambulatory Visit: Payer: Self-pay

## 2023-07-30 ENCOUNTER — Other Ambulatory Visit: Payer: Self-pay

## 2023-07-31 ENCOUNTER — Other Ambulatory Visit: Payer: Self-pay

## 2023-08-01 ENCOUNTER — Other Ambulatory Visit: Payer: Self-pay

## 2023-08-23 ENCOUNTER — Other Ambulatory Visit: Payer: Self-pay

## 2023-08-28 ENCOUNTER — Other Ambulatory Visit: Payer: Self-pay

## 2023-08-29 ENCOUNTER — Other Ambulatory Visit: Payer: Self-pay

## 2023-08-29 MED ORDER — METOPROLOL SUCCINATE ER 50 MG PO TB24
50.0000 mg | ORAL_TABLET | Freq: Every day | ORAL | 3 refills | Status: DC
  Filled 2023-08-29: qty 90, 90d supply, fill #0
  Filled 2023-10-28: qty 90, 90d supply, fill #1
  Filled 2023-11-05: qty 90, 90d supply, fill #0
  Filled ????-??-??: fill #1

## 2023-08-29 MED ORDER — HYDROCODONE-ACETAMINOPHEN 10-325 MG PO TABS
1.0000 | ORAL_TABLET | Freq: Two times a day (BID) | ORAL | 0 refills | Status: DC
  Filled 2023-08-29: qty 60, 30d supply, fill #0

## 2023-08-30 ENCOUNTER — Other Ambulatory Visit: Payer: Self-pay

## 2023-09-21 IMAGING — CR DG RIBS W/ CHEST 3+V*R*
4 series · 4 of 4 positions shown · non-contrast
Comparison: None Available.

CLINICAL DATA: Right rib pain status post blunt trauma 2 weeks ago.

EXAM:
RIGHT RIBS AND CHEST - 3+ VIEW

[w chest pa *]
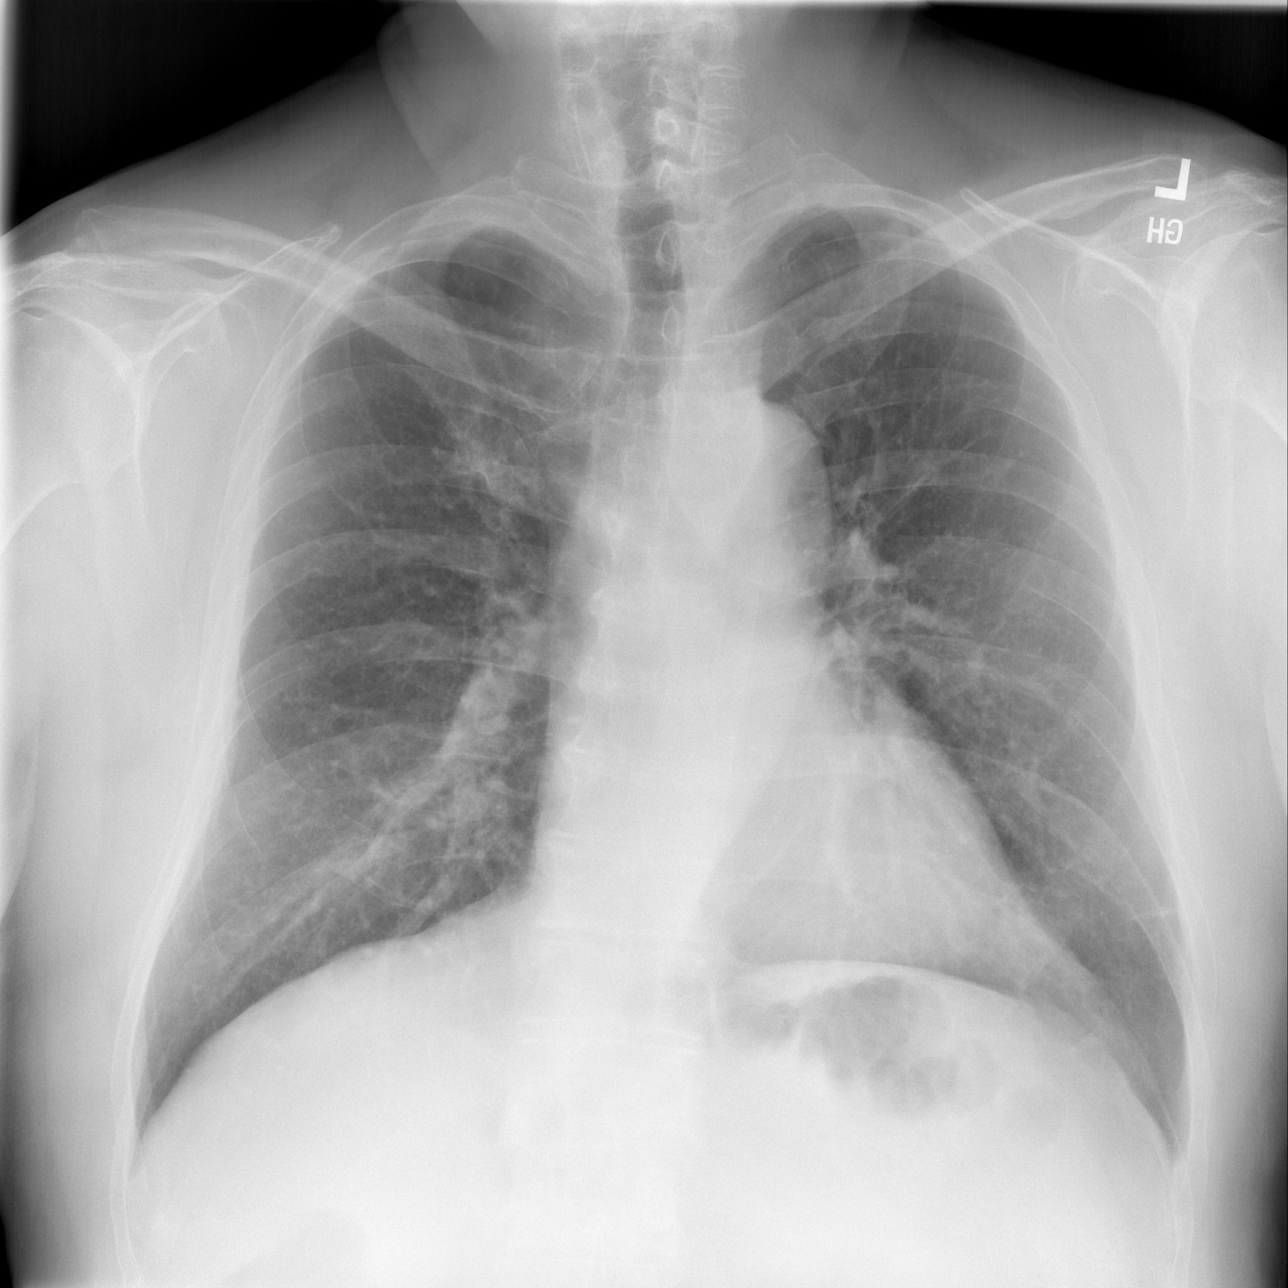

[w ribs ap/pa upper right *]
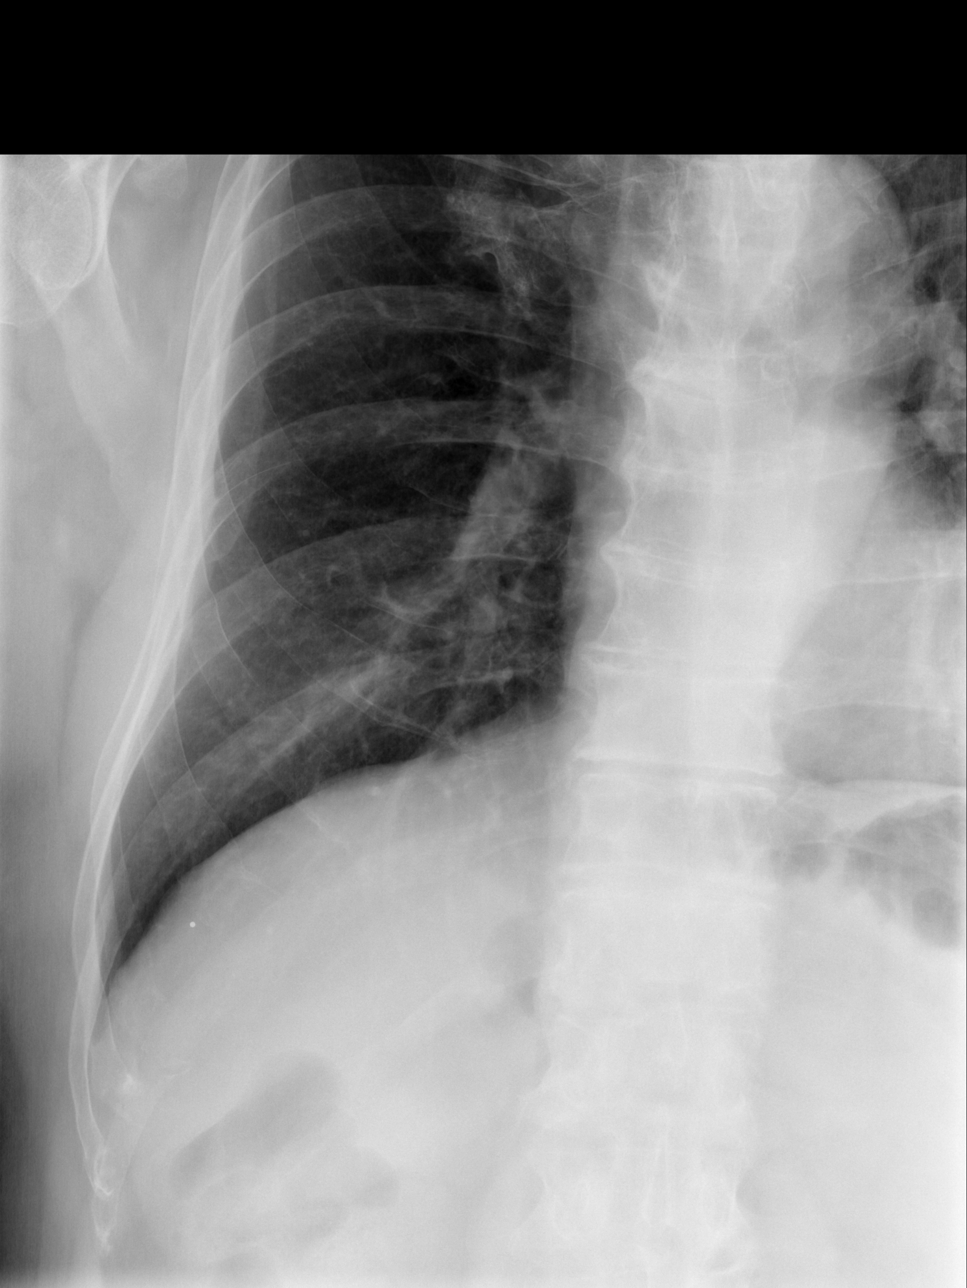

[w ribs oblique right * (1 of 2)]
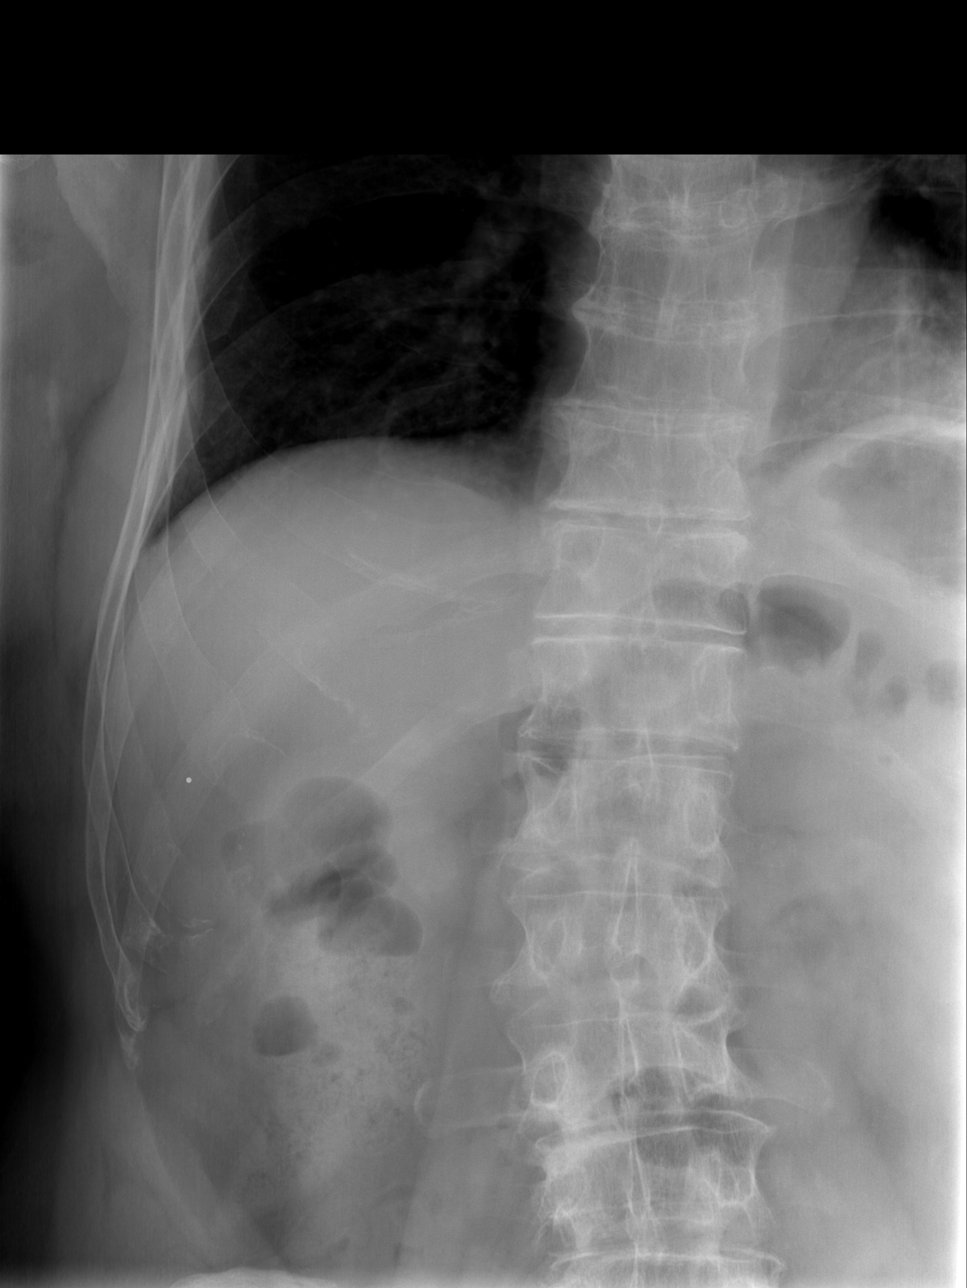

[w ribs oblique right * (2 of 2)]
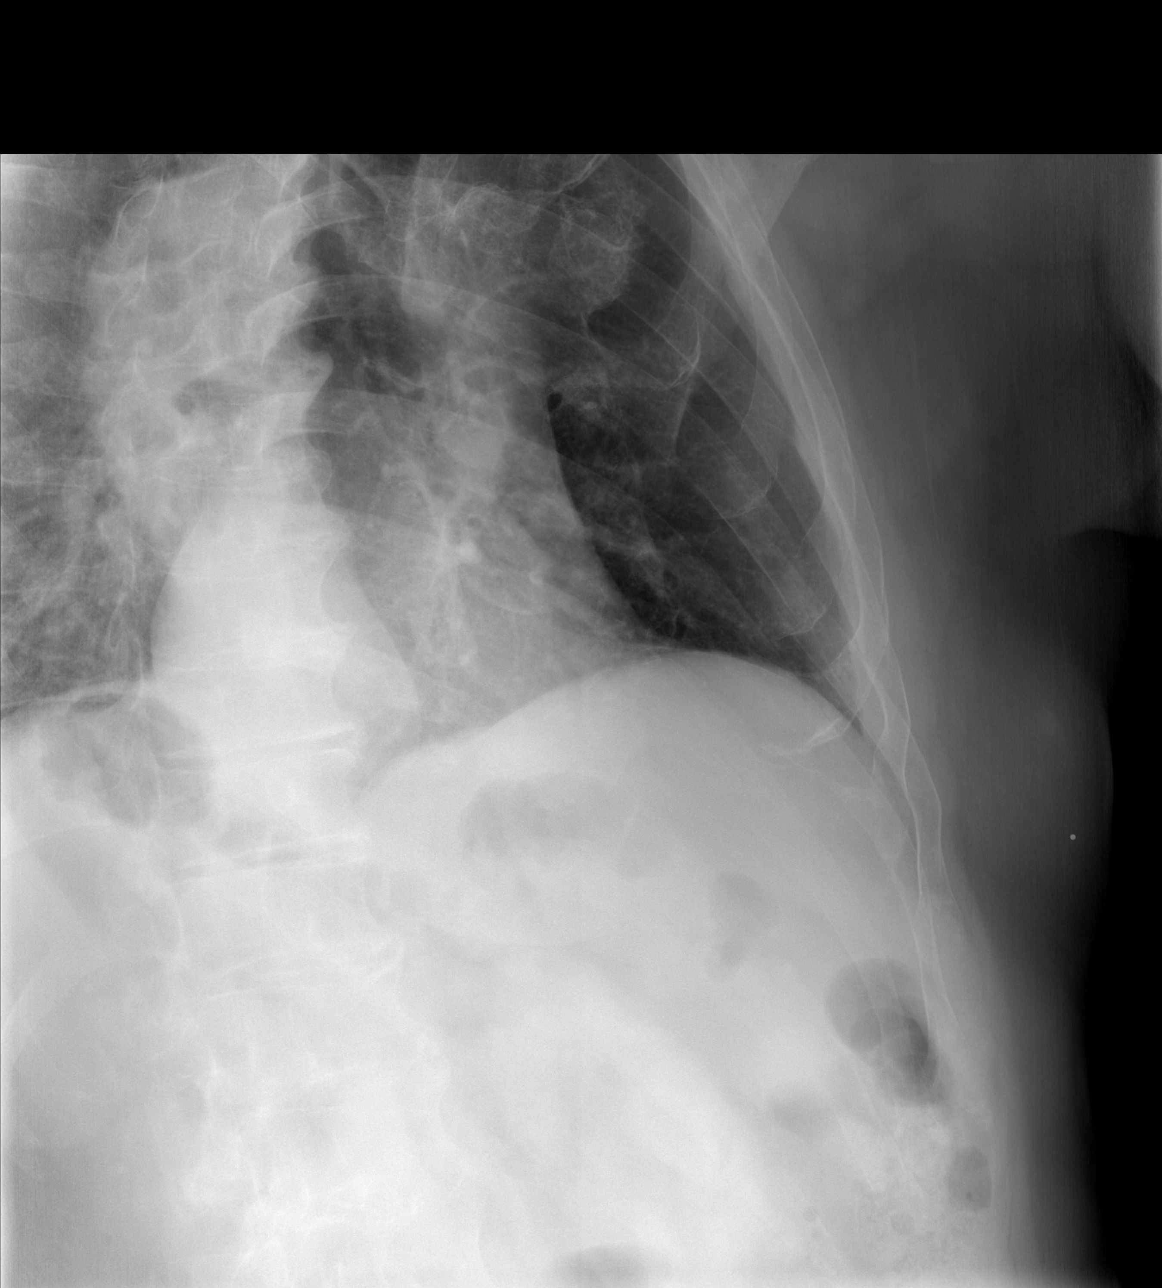

[4 of 4 positions shown; findings below may reference images not displayed]

FINDINGS: There is mild cortical discontinuity probably in the right sixth rib
suggesting fracture. There is no evidence of pneumothorax or pleural
effusion. Both lungs are clear. Heart size and mediastinal contours
are within normal limits. Aorta is tortuous.
IMPRESSION: There is mild cortical discontinuity probably in the right sixth rib
suggesting fracture.

## 2023-09-26 ENCOUNTER — Other Ambulatory Visit: Payer: Self-pay

## 2023-09-27 ENCOUNTER — Other Ambulatory Visit: Payer: Self-pay

## 2023-09-27 MED ORDER — HYDROCODONE-ACETAMINOPHEN 10-325 MG PO TABS
1.0000 | ORAL_TABLET | Freq: Two times a day (BID) | ORAL | 0 refills | Status: DC
  Filled 2023-09-27: qty 60, 30d supply, fill #0

## 2023-10-28 ENCOUNTER — Other Ambulatory Visit: Payer: Self-pay

## 2023-10-28 ENCOUNTER — Other Ambulatory Visit (HOSPITAL_COMMUNITY): Payer: Self-pay

## 2023-10-29 ENCOUNTER — Other Ambulatory Visit: Payer: Self-pay

## 2023-10-29 ENCOUNTER — Other Ambulatory Visit (HOSPITAL_COMMUNITY): Payer: Self-pay

## 2023-10-29 MED ORDER — HYDROCODONE-ACETAMINOPHEN 10-325 MG PO TABS
1.0000 | ORAL_TABLET | Freq: Two times a day (BID) | ORAL | 0 refills | Status: AC
Start: 2023-10-29 — End: ?
  Filled 2023-10-29 (×2): qty 60, 30d supply, fill #0

## 2023-10-30 ENCOUNTER — Other Ambulatory Visit: Payer: Self-pay

## 2023-10-31 ENCOUNTER — Other Ambulatory Visit (HOSPITAL_COMMUNITY): Payer: Self-pay

## 2023-11-07 ENCOUNTER — Other Ambulatory Visit: Payer: Self-pay

## 2023-11-07 ENCOUNTER — Other Ambulatory Visit (HOSPITAL_COMMUNITY): Payer: Self-pay

## 2023-11-08 ENCOUNTER — Other Ambulatory Visit: Payer: Self-pay

## 2023-11-29 ENCOUNTER — Other Ambulatory Visit: Payer: Self-pay

## 2023-11-29 ENCOUNTER — Other Ambulatory Visit (HOSPITAL_COMMUNITY): Payer: Self-pay

## 2023-12-02 ENCOUNTER — Other Ambulatory Visit: Payer: Self-pay

## 2023-12-02 MED ORDER — HYDROCODONE-ACETAMINOPHEN 10-325 MG PO TABS
1.0000 | ORAL_TABLET | Freq: Two times a day (BID) | ORAL | 0 refills | Status: DC
  Filled 2023-12-02: qty 60, 30d supply, fill #0

## 2023-12-03 ENCOUNTER — Other Ambulatory Visit: Payer: Self-pay

## 2023-12-04 ENCOUNTER — Other Ambulatory Visit: Payer: Self-pay

## 2023-12-26 ENCOUNTER — Other Ambulatory Visit: Payer: Self-pay

## 2023-12-26 MED ORDER — LOSARTAN POTASSIUM 50 MG PO TABS
100.0000 mg | ORAL_TABLET | Freq: Every day | ORAL | 5 refills | Status: AC
  Filled 2023-12-26 – 2023-12-27 (×2): qty 60, 30d supply, fill #0

## 2023-12-27 ENCOUNTER — Other Ambulatory Visit: Payer: Self-pay

## 2023-12-31 ENCOUNTER — Other Ambulatory Visit: Payer: Self-pay

## 2023-12-31 MED ORDER — HYDROCODONE-ACETAMINOPHEN 10-325 MG PO TABS: ORAL_TABLET | ORAL | 0 refills | Status: DC

## 2024-01-01 ENCOUNTER — Other Ambulatory Visit: Payer: Self-pay
